# Patient Record
Sex: Female | Born: 1965 | Race: Black or African American | Hispanic: No | Marital: Married
Health system: Southern US, Community
[De-identification: ages and names within clinical notes are randomized; demographics above are authoritative.]

## PROBLEM LIST (undated history)

## (undated) DIAGNOSIS — O24419 Gestational diabetes mellitus in pregnancy, unspecified control: Secondary | ICD-10-CM

## (undated) DIAGNOSIS — K589 Irritable bowel syndrome without diarrhea: Secondary | ICD-10-CM

## (undated) DIAGNOSIS — I10 Essential (primary) hypertension: Secondary | ICD-10-CM

## (undated) HISTORY — PX: TONSILLECTOMY: SUR1361

## (undated) HISTORY — DX: Gestational diabetes mellitus in pregnancy, unspecified control: O24.419

## (undated) HISTORY — DX: Irritable bowel syndrome, unspecified: K58.9

## (undated) HISTORY — DX: Essential (primary) hypertension: I10

## (undated) HISTORY — PX: TUBAL LIGATION: SHX77

---

## 2000-08-16 ENCOUNTER — Other Ambulatory Visit: Admission: RE | Admit: 2000-08-16 | Discharge: 2000-08-16 | Payer: Self-pay | Admitting: Obstetrics and Gynecology

## 2000-09-26 ENCOUNTER — Ambulatory Visit (HOSPITAL_COMMUNITY): Admission: RE | Admit: 2000-09-26 | Discharge: 2000-09-26 | Payer: Self-pay | Admitting: Obstetrics and Gynecology

## 2000-09-26 ENCOUNTER — Encounter: Payer: Self-pay | Admitting: Obstetrics and Gynecology

## 2001-01-25 ENCOUNTER — Ambulatory Visit (HOSPITAL_COMMUNITY): Admission: RE | Admit: 2001-01-25 | Discharge: 2001-01-25 | Payer: Self-pay | Admitting: Obstetrics and Gynecology

## 2001-01-25 ENCOUNTER — Encounter: Payer: Self-pay | Admitting: Obstetrics and Gynecology

## 2001-02-11 ENCOUNTER — Inpatient Hospital Stay (HOSPITAL_COMMUNITY): Admission: AD | Admit: 2001-02-11 | Discharge: 2001-02-14 | Payer: Self-pay | Admitting: Obstetrics and Gynecology

## 2001-02-11 ENCOUNTER — Encounter (INDEPENDENT_AMBULATORY_CARE_PROVIDER_SITE_OTHER): Payer: Self-pay

## 2001-02-15 ENCOUNTER — Encounter: Admission: RE | Admit: 2001-02-15 | Discharge: 2001-03-17 | Payer: Self-pay | Admitting: Obstetrics and Gynecology

## 2001-03-18 ENCOUNTER — Encounter: Admission: RE | Admit: 2001-03-18 | Discharge: 2001-04-17 | Payer: Self-pay | Admitting: Obstetrics and Gynecology

## 2001-04-18 ENCOUNTER — Encounter: Admission: RE | Admit: 2001-04-18 | Discharge: 2001-05-18 | Payer: Self-pay | Admitting: Obstetrics and Gynecology

## 2001-06-16 ENCOUNTER — Encounter: Admission: RE | Admit: 2001-06-16 | Discharge: 2001-07-16 | Payer: Self-pay | Admitting: Obstetrics and Gynecology

## 2001-08-16 ENCOUNTER — Encounter: Admission: RE | Admit: 2001-08-16 | Discharge: 2001-09-15 | Payer: Self-pay | Admitting: Obstetrics and Gynecology

## 2002-06-16 ENCOUNTER — Inpatient Hospital Stay (HOSPITAL_COMMUNITY): Admission: AD | Admit: 2002-06-16 | Discharge: 2002-06-16 | Payer: Self-pay | Admitting: Obstetrics and Gynecology

## 2002-07-06 ENCOUNTER — Other Ambulatory Visit: Admission: RE | Admit: 2002-07-06 | Discharge: 2002-07-06 | Payer: Self-pay | Admitting: Obstetrics and Gynecology

## 2003-04-30 ENCOUNTER — Encounter: Admission: RE | Admit: 2003-04-30 | Discharge: 2003-04-30 | Payer: Self-pay | Admitting: Obstetrics and Gynecology

## 2003-09-03 ENCOUNTER — Other Ambulatory Visit: Admission: RE | Admit: 2003-09-03 | Discharge: 2003-09-03 | Payer: Self-pay | Admitting: Obstetrics and Gynecology

## 2004-12-09 ENCOUNTER — Other Ambulatory Visit: Admission: RE | Admit: 2004-12-09 | Discharge: 2004-12-09 | Payer: Self-pay | Admitting: Obstetrics and Gynecology

## 2005-03-23 ENCOUNTER — Ambulatory Visit: Payer: Self-pay | Admitting: Gastroenterology

## 2005-12-13 ENCOUNTER — Other Ambulatory Visit: Admission: RE | Admit: 2005-12-13 | Discharge: 2005-12-13 | Payer: Self-pay | Admitting: Obstetrics and Gynecology

## 2008-01-22 ENCOUNTER — Encounter: Admission: RE | Admit: 2008-01-22 | Discharge: 2008-01-22 | Payer: Self-pay | Admitting: Obstetrics and Gynecology

## 2009-03-03 ENCOUNTER — Encounter: Admission: RE | Admit: 2009-03-03 | Discharge: 2009-03-03 | Payer: Self-pay | Admitting: Obstetrics and Gynecology

## 2010-03-03 ENCOUNTER — Encounter
Admission: RE | Admit: 2010-03-03 | Discharge: 2010-03-03 | Payer: Self-pay | Source: Home / Self Care | Attending: Obstetrics and Gynecology | Admitting: Obstetrics and Gynecology

## 2010-07-31 NOTE — Op Note (Signed)
[facility]  Patient:    Megan Cuevas, Megan Cuevas Visit Number: [id number] MRN: [id number]          Service Type: OBS Location: 910A 9118 01 Attending Physician:  Megan Cuevas Dictated by:   Alvino Chapel, M.D. Proc. Date: 02/11/01 Admit Date:  02/11/2001                             Operative Report  PREOPERATIVE DIAGNOSES:         1. Term pregnancy at 38+ weeks delivered.                                 2. Previous cesarean section.                                 3. Failed vaginal birth after cesarean.  POSTOPERATIVE DIAGNOSES:        1. Term pregnancy at 38+ weeks delivered.                                 2. Previous cesarean section.                                 3. Failed vaginal birth after cesarean                                    section.  OPERATION:                      Repeat low transverse cesarean section with bilateral tubal ligation.  SURGEON:                        Alvino Chapel, M.D.  ANESTHESIA:                     Epidural.  FINDINGS:                       Viable female infant in the vertex presentation. Apgars are 8 and 9.  Weight is 8 pounds 9 ounces.  There are normal ovaries and tubes and uterus noted at the time of section.  ESTIMATED BLOOD LOSS:           600 cc.  URINE OUTPUT:                   300 cc clear and IV fluids 1800 cc LR.  DESCRIPTION OF PROCEDURE:       The patient was taken to the operating room where epidural anesthesia was found to be adequate by Allis clamp test.  She was then prepped and draped in a normal sterile fashion in the dorsal supine position with a leftward tilt.  A Pfannenstiel skin incision was then made after a wedge resection of a preexisting scar which was somewhat thickened, and this was carried down to the layer of fascia by sharp dissection and Bovie cautery.  The fascia was then nicked in the midline with Bovie cautery, and the incision was extended laterally  with Mayo scissors.  The inferior aspect of the incision was grasped with Kocher clamps, elevated and dissected off the underlying rectus muscles and in a similar fashion the superior aspect was elevated and dissected off the underlying rectus muscles.  The rectus muscles were then separated in the midline and the peritoneum identified and entered sharply.  The peritoneal incision was then extended both superiorly and inferiorly with careful attention to avoid both bowel and bladder.  The bladder blade was then inserted.  The vesicouterine peritoneum was identified, and the bladder flap created with Metzenbaum scissors.  The bladder blade was then reinserted and the lower uterine segment exposed.  The uterus was then incised in a transverse fashion and the infant delivered atraumatically.  The infant was bulb suctioned, the cord clamped and handed off to the awaiting pediatricians.  The placenta was then delivered manually and the uterus cleared of all clots and debris with a moist lap sponge.  The uterine incision was then closed with 1-0 chromic in a running locked fashion.  With this closed, attention was turned to the patients right fallopian tube which was grasped with Babcock clamp, elevated and 2-3 cm buckle tied off with two free ties of 0 plain.  This portion was then amputated and handed off to pathology and the free ends cauterized with Bovie cautery.  In a similar fashion, the left fallopian tube was grasped with a Babcock, elevated into a 2 cm buckle and doubly ligated with 0 plain.  This portion was also amputated and handed to pathology with the free end pedicle cauterized.  Both tubal pedicles were hemostatic, therefore returned to their position.  There was an adhesion of the omentum to the anterior fundus which was taken down with Bovie cautery. The uterine incision was once again inspected and one small area of bleeding was controlled with a single figure-of-eight suture  and 0 chromic.  It was once again inspected and found to be hemostatic.  Therefore, all instruments and sponges were removed from the abdomen.  The rectus muscles were reapproximated with two interrupted sutures of 0 chromic.  The fascia was closed with 0 Vicryl in a running fashion, and the skin was closed with staples.  Sponge, lap and needle counts were correct x 2, and the patient was taken to the recovery room in stable condition. Dictated by:   Alvino Chapel, M.D. Attending Physician:  Megan Cuevas DD:  02/11/01 TD:  02/11/01 Job: [id number]

## 2010-07-31 NOTE — Discharge Summary (Signed)
[facility]  Patient:    EUVA, RUNDELL Visit Number: [id number] MRN: [id number]          Service Type: OBS Location: 910A 9118 01 Attending Physician:  Oliver Pila Dictated by:   Alvino Chapel, M.D. Admit Date:  02/11/2001 Discharge Date: 02/14/2001                             Discharge Summary  DISCHARGE DIAGNOSES:          1. Term pregnancy at 38 weeks, delivered.                               2. Previous cesarean section.                               3. Failed vaginal birth after cesarean section.                               4. Repeat low transverse cesarean section.                               5. Bilateral partial salpingectomy.  DISCHARGE MEDICATIONS:        1. Motrin 600 mg p.o. q.6h. p.r.n.                               2. Percocet one to two tablets p.o. q.4h. p.r.n.  DISCHARGE FOLLOW UP:          The patient is to follow up in my office in approximately two days for staple removal.  HISTORY OF PRESENT ILLNESS:   Ardelia is a 45 year old, G2, P1-0-0-1 who is admitted at 38-0/7 weeks with the complaint of contractions every three minutes.  Cervix in triage was initially completely effaced, 7 cm and minus 1 station.  Given her advanced cervical dilation, the patient did wish an attempt at vaginal birth and therefore was admitted in labor.  Pregnancy had been complicated by history of gestational diabetes with the first pregnancy; however, the one hour Glucola had been tested twice in this pregnancy and was normal.  She had a previous low transverse cesarean section for rest of descend and was cleared for trial of labor.  PRENATAL LABORATORY DATA:     O positive antibody negative, RPR nonreactive, Rubella immune, hepatitis B surface antigen negative, HIV negative, GC negative, Chlamydia negative.  Group B Strep unknown.  PAST OB HISTORY:              In 1999 she had a low transverse cesarean section of a 6 pound  15 ounce infant.  PAST GYN HISTORY:             History of a Bartholin cyst drained.  PAST MEDICAL HISTORY:         Gestational diabetes with her first pregnancy.  PAST SURGICAL HISTORY:        Cesarean section times one and Bartholin cyst drainage.  PHYSICAL EXAMINATION:         On admission she was afebrile with stable vital signs.  Fetal heart rate was reactive.  HOSPITAL COURSE:  Shortly after admission she was completely effaced 9 cm and a minus 1 station, therefore she had assisted rupture of membranes with clear copious fluid obtained and was then observed for progress.  Approximately an hour and half later she was completely dilated; however, there was minimal progression of the vertex beyond a minus 1 station. A trial of pushing was attempted for approximately 45 minutes and very little progress was made as far descent of the fetal vertex.  At this point, the patient was counselled that likely the size was prohibiting delivery of this baby and she was counselled and desired to proceed with a repeat cesarean section and a concurrent tubal ligation.  She underwent a repeat low transverse cesarean section and bilateral tubal ligation without difficulty. She was delivered of a vigorous female infant.  Apgars were 8 and 9.  Weight was 8 pounds 9 ounces and had her tubal ligation without difficulty. On postoperative day three, the patient was doing very well.  She was passing flatus, tolerating a regular diet.  She had had a minimal temperature to 100.1; however, had been afebrile for approximately 12 hours prior to discharge, therefore, she was felt stable for discharge home and would monitor her temperature there and call for any temperature greater than 101. Dictated by:   Alvino Chapel, M.D. Attending Physician:  Oliver Pila DD:  02/14/01 TD:  02/14/01 Job: [id number]

## 2011-02-01 ENCOUNTER — Other Ambulatory Visit: Payer: Self-pay | Admitting: Obstetrics and Gynecology

## 2011-02-01 DIAGNOSIS — Z1231 Encounter for screening mammogram for malignant neoplasm of breast: Secondary | ICD-10-CM

## 2011-03-05 ENCOUNTER — Ambulatory Visit: Payer: Self-pay

## 2011-03-12 ENCOUNTER — Ambulatory Visit: Payer: Self-pay

## 2011-05-27 ENCOUNTER — Ambulatory Visit
Admission: RE | Admit: 2011-05-27 | Discharge: 2011-05-27 | Disposition: A | Discharge status: Home / Self Care | Payer: BC Managed Care – PPO | Source: Ambulatory Visit | Attending: Obstetrics and Gynecology | Admitting: Obstetrics and Gynecology

## 2011-05-27 DIAGNOSIS — Z1231 Encounter for screening mammogram for malignant neoplasm of breast: Secondary | ICD-10-CM

## 2012-03-15 HISTORY — PX: COLONOSCOPY: SHX174

## 2012-04-11 ENCOUNTER — Encounter: Payer: Self-pay | Admitting: Gastroenterology

## 2012-04-12 ENCOUNTER — Encounter: Payer: Self-pay | Admitting: *Deleted

## 2012-04-20 ENCOUNTER — Ambulatory Visit: Payer: BC Managed Care – PPO | Admitting: Gastroenterology

## 2012-04-25 ENCOUNTER — Other Ambulatory Visit: Payer: Self-pay | Admitting: Obstetrics and Gynecology

## 2012-04-25 DIAGNOSIS — Z1231 Encounter for screening mammogram for malignant neoplasm of breast: Secondary | ICD-10-CM

## 2012-04-28 ENCOUNTER — Ambulatory Visit: Payer: BC Managed Care – PPO | Admitting: Gastroenterology

## 2012-05-09 ENCOUNTER — Other Ambulatory Visit (INDEPENDENT_AMBULATORY_CARE_PROVIDER_SITE_OTHER): Payer: BC Managed Care – PPO

## 2012-05-09 ENCOUNTER — Ambulatory Visit (INDEPENDENT_AMBULATORY_CARE_PROVIDER_SITE_OTHER): Payer: BC Managed Care – PPO | Admitting: Gastroenterology

## 2012-05-09 ENCOUNTER — Encounter: Payer: Self-pay | Admitting: Gastroenterology

## 2012-05-09 VITALS — BP 102/70 | HR 88 | Ht 63.0 in | Wt 140.0 lb

## 2012-05-09 DIAGNOSIS — R195 Other fecal abnormalities: Secondary | ICD-10-CM

## 2012-05-09 LAB — IBC PANEL: Transferrin: 329.3 mg/dL (ref 212.0–360.0)

## 2012-05-09 LAB — FERRITIN: Ferritin: 110.7 ng/mL (ref 10.0–291.0)

## 2012-05-09 LAB — VITAMIN B12: Vitamin B-12: 275 pg/mL (ref 211–911)

## 2012-05-09 MED ORDER — LINACLOTIDE 145 MCG PO CAPS: 145.0000 ug | ORAL_CAPSULE | Freq: Every day | ORAL | Status: DC

## 2012-05-09 MED ORDER — MOVIPREP 100 G PO SOLR: 1.0000 | Freq: Once | ORAL | Status: DC

## 2012-05-09 NOTE — Patient Instructions (Addendum)
You were given a sheet today about Artificial Sweeteners for you to follow.  Please purchase Lactaid over the counter and use as needed.  You were given samples today of Linzess 145 mcg, please take one capsule by mouth once daily. If this works well for you please call back for a prescription.  Please purchase Metamucil over the counter. Take as directed.  Please follow high fiber diet below.  Your physician has requested that you go to the basement for the following lab work before leaving today: Anemia panel, and Celiac Panel.  You have been scheduled for a colonoscopy with propofol. Please follow written instructions given to you at your visit today.  Please pick up your prep kit at the pharmacy within the next 1-3 days. If you use inhalers (even only as needed) or a CPAP machine, please bring them with you on the day of your procedure.   ______________________________________________________________________________________________________________________  High-Fiber Diet Fiber is found in fruits, vegetables, and grains. A high-fiber diet encourages the addition of more whole grains, legumes, fruits, and vegetables in your diet. The recommended amount of fiber for adult males is 38 g per day. For adult females, it is 25 g per day. Pregnant and lactating women should get 28 g of fiber per day. If you have a digestive or bowel problem, ask your caregiver for advice before adding high-fiber foods to your diet. Eat a variety of high-fiber foods instead of only a select few type of foods.  PURPOSE  To increase stool bulk.  To make bowel movements more regular to prevent constipation.  To lower cholesterol.  To prevent overeating. WHEN IS THIS DIET USED?  It may be used if you have constipation and hemorrhoids.  It may be used if you have uncomplicated diverticulosis (intestine condition) and irritable bowel syndrome.  It may be used if you need help with weight management.  It  may be used if you want to add it to your diet as a protective measure against atherosclerosis, diabetes, and cancer. SOURCES OF FIBER  Whole-grain breads and cereals.  Fruits, such as apples, oranges, bananas, berries, prunes, and pears.  Vegetables, such as green peas, carrots, sweet potatoes, beets, broccoli, cabbage, spinach, and artichokes.  Legumes, such split peas, soy, lentils.  Almonds. FIBER CONTENT IN FOODS Starches and Grains / Dietary Fiber (g)  Cheerios, 1 cup / 3 g  Corn Flakes cereal, 1 cup / 0.7 g  Rice crispy treat cereal, 1 cup / 0.3 g  Instant oatmeal (cooked),  cup / 2 g  Frosted wheat cereal, 1 cup / 5.1 g  Brown, long-grain rice (cooked), 1 cup / 3.5 g  White, long-grain rice (cooked), 1 cup / 0.6 g  Enriched macaroni (cooked), 1 cup / 2.5 g Legumes / Dietary Fiber (g)  Baked beans (canned, plain, or vegetarian),  cup / 5.2 g  Kidney beans (canned),  cup / 6.8 g  Pinto beans (cooked),  cup / 5.5 g Breads and Crackers / Dietary Fiber (g)  Plain or honey graham crackers, 2 squares / 0.7 g  Saltine crackers, 3 squares / 0.3 g  Plain, salted pretzels, 10 pieces / 1.8 g  Whole-wheat bread, 1 slice / 1.9 g  White bread, 1 slice / 0.7 g  Raisin bread, 1 slice / 1.2 g  Plain bagel, 3 oz / 2 g  Flour tortilla, 1 oz / 0.9 g  Corn tortilla, 1 small / 1.5 g  Hamburger or hotdog bun, 1 small /  0.9 g Fruits / Dietary Fiber (g)  Apple with skin, 1 medium / 4.4 g  Sweetened applesauce,  cup / 1.5 g  Banana,  medium / 1.5 g  Grapes, 10 grapes / 0.4 g  Orange, 1 small / 2.3 g  Raisin, 1.5 oz / 1.6 g  Melon, 1 cup / 1.4 g Vegetables / Dietary Fiber (g)  Green beans (canned),  cup / 1.3 g  Carrots (cooked),  cup / 2.3 g  Broccoli (cooked),  cup / 2.8 g  Peas (cooked),  cup / 4.4 g  Mashed potatoes,  cup / 1.6 g  Lettuce, 1 cup / 0.5 g  Corn (canned),  cup / 1.6 g  Tomato,  cup / 1.1 g Document Released:  03/01/2005 Document Revised: 08/31/2011 Document Reviewed: 06/03/2011 [facility] Patient Information 2013 [place], [place].

## 2012-05-09 NOTE — Progress Notes (Signed)
History of Present Illness:  This is a 47 year old African American female referred by Dr. Theadora Rama her chronic functional constipation with associated gas and bloating.  This patient has had constipation for many years with recent worsening over the last several months. . Recently she underwent an episode where there was no bowel movement for 2 weeks requiring rereated MiraLax therapy.  She does have mild lactose intolerance and avoids these substances ,but does eat a  large amount of broccoli daily.  With her constipation, gas and bloating, she will have a soft semi-formed bowel movement every 3-4 days without melena or hematochezia.  Otherwise she's had no anorexia, weight loss, or specific food intolerances besides lactose.  There is no history of upper GI or hepatobiliary complaints.  She relates her lab data has all been normal with yearly lab exams.  She does take daily probiotics with rather marked improvement her symptoms.  She's not had previous colonoscopy or barium enema.  Patient is otherwise healthy without general medical or gynecologic problems.  He works as a Forensic scientist.   I have reviewed this patient's present history, medical and surgical past history, allergies and medications.     ROS:   All systems were reviewed and are negative unless otherwise stated in the HPI.... no gynecologic problems patient is on Aviane estradiol daily.  Not on File No outpatient prescriptions prior to visit.   No facility-administered medications prior to visit.   Past Medical History  Diagnosis Date  . Gestational diabetes    Past Surgical History  Procedure Laterality Date  . Cesarean section      x 2  . Tonsillectomy     History   Social History  . Marital Status: Married    Spouse Name: N/A    Number of Children: 2  . Years of Education: N/A   Occupational History  . ENGINEER [employer]   Social History Main Topics  . Smoking status: Never Smoker   . Smokeless tobacco:  Never Used  . Alcohol Use: Yes     Comment: 1 glass a week  . Drug Use: No  . Sexually Active: None   Other Topics Concern  . None   Social History Narrative  . None   Family History  Problem Relation Age of Onset  . Diabetes Mother   . Diabetes Maternal Grandmother   . Breast cancer Paternal Aunt     x 2       Physical Exam: Blood pressure 102/70, pulse 88 and regular, and weight 140 with a BMI of 24.81.  98% oxygen saturation at room air. General well developed well nourished patient in no acute distress, appearing their stated age Eyes PERRLA, no icterus, fundoscopic exam per opthamologist Skin no lesions noted Neck supple, no adenopathy, no thyroid enlargement, no tenderness Chest clear to percussion and auscultation Heart no significant murmurs, gallops or rubs noted Abdomen no hepatosplenomegaly masses or tenderness, BS normal.  Rectal inspection normal no fissures, or fistulae noted.  No masses or tenderness on digital exam. Stool guaiac negative. Extremities no acute joint lesions, edema, phlebitis or evidence of cellulitis. Neurologic patient oriented x 3, cranial nerves intact, no focal neurologic deficits noted... reflexes normal. Psychological mental status normal and normal affect.  Assessment and plan: Chronic functional constipation without symptoms of rectal outlet dysfunction.  I suspect she has mild chronic atony with associated lactose intolerance.  I've scheduled her for colonoscopy exam to exclude any structural or inflammatory process.  I've asked her  to follow a high fiber diet with daily Metamucil and liberal by mouth fluids, and given her a trial of Linzess 145 mcg a day.  She is use when necessary Lactaid tablets with major dairy products and avoid sorbitol and fructose in her diet.  I have ordered anemia and celiac panel for review.  Family history is entirely noncontributory.  No diagnosis found.

## 2012-05-10 ENCOUNTER — Encounter: Payer: Self-pay | Admitting: Gastroenterology

## 2012-05-10 LAB — CELIAC PANEL 10
Endomysial Screen: NEGATIVE
Gliadin IgA: 7.7 U/mL (ref <20)
Tissue Transglut Ab: 10.8 U/mL (ref <20)
Tissue Transglutaminase Ab, IgA: 4.5 U/mL (ref <20)

## 2012-05-29 ENCOUNTER — Ambulatory Visit
Admission: RE | Admit: 2012-05-29 | Discharge: 2012-05-29 | Disposition: A | Discharge status: Home / Self Care | Payer: BC Managed Care – PPO | Source: Ambulatory Visit | Attending: Obstetrics and Gynecology | Admitting: Obstetrics and Gynecology

## 2012-06-02 ENCOUNTER — Encounter: Payer: BC Managed Care – PPO | Admitting: Gastroenterology

## 2012-06-05 ENCOUNTER — Encounter: Payer: Self-pay | Admitting: Gastroenterology

## 2012-06-05 ENCOUNTER — Ambulatory Visit (AMBULATORY_SURGERY_CENTER): Payer: BC Managed Care – PPO | Admitting: Gastroenterology

## 2012-06-05 VITALS — BP 120/72 | HR 72 | Temp 99.7°F | Resp 17 | Ht 63.0 in | Wt 140.0 lb

## 2012-06-05 DIAGNOSIS — Z1211 Encounter for screening for malignant neoplasm of colon: Secondary | ICD-10-CM

## 2012-06-05 DIAGNOSIS — K5900 Constipation, unspecified: Secondary | ICD-10-CM

## 2012-06-05 DIAGNOSIS — R195 Other fecal abnormalities: Secondary | ICD-10-CM

## 2012-06-05 MED ORDER — SODIUM CHLORIDE 0.9 % IV SOLN: 500.0000 mL | INTRAVENOUS | Status: DC

## 2012-06-05 NOTE — Op Note (Signed)
[facility] [address] [place], [address]   COLONOSCOPY PROCEDURE REPORT  PATIENT: Megan Cuevas, Megan Cuevas  MR#: [id number] BIRTHDATE: 1965/06/06 , 46  yrs. old GENDER: Female ENDOSCOPIST: Mardella Layman, MD, Clementeen Graham REFERRED BY:  Theadora Rama, M.D. PROCEDURE DATE:  06/05/2012 PROCEDURE:   Colonoscopy, screening ASA CLASS:   Class I INDICATIONS:Average risk patient for colon cancer. MEDICATIONS: propofol (Diprivan) 200mg  IV  DESCRIPTION OF PROCEDURE:   After the risks and benefits and of the procedure were explained, informed consent was obtained.  A digital rectal exam revealed no abnormalities of the rectum.    The LB [id number]  endoscope was introduced through the anus and advanced to the cecum, which was identified by both the appendix and ileocecal valve .  The quality of the prep was excellent, using MoviPrep .  The instrument was then slowly withdrawn as the colon was fully examined.     COLON FINDINGS: A normal appearing cecum, ileocecal valve, and appendiceal orifice were identified.  The ascending, hepatic flexure, transverse, splenic flexure, descending, sigmoid colon and rectum appeared unremarkable.  No polyps or cancers were seen.Very tortuous colon noted !!!!     Retroflexed views revealed no abnormalities.     The scope was then withdrawn from the patient and the procedure completed.  COMPLICATIONS: There were no complications. ENDOSCOPIC IMPRESSION: Normal colon ..tortuous and redundant colon noted...no IBD noted...  RECOMMENDATIONS: 1.  High fiber diet with liberal fluid intake. 2.  Metamucil or benefiber 3.  Titrate to need.Marland KitchenMarland KitchenMiralax or chronulac   REPEAT EXAM:  cc:  _______________________________ eSignedMardella Layman, MD, [facility] 06/05/2012 11:42 AM

## 2012-06-05 NOTE — Patient Instructions (Addendum)
YOU HAD AN ENDOSCOPIC PROCEDURE TODAY AT THE [facility]: Refer to the procedure report that was given to you for any specific questions about what was found during the examination.  If the procedure report does not answer your questions, please call your gastroenterologist to clarify.  If you requested that your care partner not be given the details of your procedure findings, then the procedure report has been included in a sealed envelope for you to review at your convenience later.  YOU SHOULD EXPECT: Some feelings of bloating in the abdomen. Passage of more gas than usual.  Walking can help get rid of the air that was put into your GI tract during the procedure and reduce the bloating. If you had a lower endoscopy (such as a colonoscopy or flexible sigmoidoscopy) you may notice spotting of blood in your stool or on the toilet paper. If you underwent a bowel prep for your procedure, then you may not have a normal bowel movement for a few days.  DIET: Your first meal following the procedure should be a light meal and then it is ok to progress to your normal diet.  A half-sandwich or bowl of soup is an example of a good first meal.  Heavy or fried foods are harder to digest and may make you feel nauseous or bloated.  Likewise meals heavy in dairy and vegetables can cause extra gas to form and this can also increase the bloating.  Drink plenty of fluids but you should avoid alcoholic beverages for 24 hours.  Dr. Jarold Motto wants you to use mirilax for constipation relief.  He also wants you to eat a high fiber diet.  Your colonoscopy was normal outside of your twisty colon.  Drink plenty of fluids during the day.  ACTIVITY: Your care partner should take you home directly after the procedure.  You should plan to take it easy, moving slowly for the rest of the day.  You can resume normal activity the day after the procedure however you should NOT DRIVE or use heavy machinery for 24 hours (because of  the sedation medicines used during the test).    SYMPTOMS TO REPORT IMMEDIATELY: A gastroenterologist can be reached at any hour.  During normal business hours, 8:30 AM to 5:00 PM Monday through Friday, call [phone].  After hours and on weekends, please call the GI answering service at [phone] who will take a message and have the physician on call contact you.   Following lower endoscopy (colonoscopy or flexible sigmoidoscopy):  Excessive amounts of blood in the stool  Significant tenderness or worsening of abdominal pains  Swelling of the abdomen that is new, acute  Fever of 100F or higher FOLLOW UP: If any biopsies were taken you will be contacted by phone or by letter within the next 1-3 weeks.  Call your gastroenterologist if you have not heard about the biopsies in 3 weeks.  Our staff will call the home number listed on your records the next business day following your procedure to check on you and address any questions or concerns that you may have at that time regarding the information given to you following your procedure. This is a courtesy call and so if there is no answer at the home number and we have not heard from you through the emergency physician on call, we will assume that you have returned to your regular daily activities without incident.  SIGNATURES/CONFIDENTIALITY: You and/or your care partner have signed paperwork which  will be entered into your electronic medical record.  These signatures attest to the fact that that the information above on your After Visit Summary has been reviewed and is understood.  Full responsibility of the confidentiality of this discharge information lies with you and/or your care-partner.

## 2012-06-05 NOTE — Progress Notes (Addendum)
Patient did not have preoperative order for IV antibiotic SSI prophylaxis. [phone])  Patient did not experience any of the following events: a burn prior to discharge; a fall within the facility; wrong site/side/patient/procedure/implant event; or a hospital transfer or hospital admission upon discharge from the facility. [phone])  Patient states has lower abd pain just before discharge.  She states that this is nothing unusual, that her colon gets trapped air all of the time.  She stated that she would go home and lie down with a heating pad.  I suggested beano.  She stated that she would try that and start the mirilax in the am.  Patient insisted on going home at this time.  She will call if pain worsens.

## 2012-06-06 ENCOUNTER — Telehealth: Payer: Self-pay | Admitting: *Deleted

## 2012-06-06 NOTE — Telephone Encounter (Signed)
  Follow up Call-  Call back number 06/05/2012  Post procedure Call Back phone  # [phone]  Permission to leave phone message Yes     Patient questions:  Do you have a fever, pain , or abdominal swelling? no Pain Score  0 *  Have you tolerated food without any problems? yes  Have you been able to return to your normal activities? yes  Do you have any questions about your discharge instructions: Diet   no Medications  no Follow up visit  no  Do you have questions or concerns about your Care? no  Actions: * If pain score is 4 or above: No action needed, pain <4.

## 2012-07-02 ENCOUNTER — Emergency Department (HOSPITAL_COMMUNITY)
Admission: EM | Admit: 2012-07-02 | Discharge: 2012-07-02 | Disposition: A | Discharge status: Home / Self Care | Payer: BC Managed Care – PPO | Attending: Emergency Medicine | Admitting: Emergency Medicine

## 2012-07-02 ENCOUNTER — Encounter (HOSPITAL_COMMUNITY): Payer: Self-pay

## 2012-07-02 ENCOUNTER — Emergency Department (HOSPITAL_COMMUNITY): Payer: BC Managed Care – PPO

## 2012-07-02 DIAGNOSIS — M6281 Muscle weakness (generalized): Secondary | ICD-10-CM | POA: Insufficient documentation

## 2012-07-02 DIAGNOSIS — M25511 Pain in right shoulder: Secondary | ICD-10-CM

## 2012-07-02 DIAGNOSIS — R52 Pain, unspecified: Secondary | ICD-10-CM | POA: Insufficient documentation

## 2012-07-02 DIAGNOSIS — R209 Unspecified disturbances of skin sensation: Secondary | ICD-10-CM | POA: Insufficient documentation

## 2012-07-02 DIAGNOSIS — Z8632 Personal history of gestational diabetes: Secondary | ICD-10-CM | POA: Insufficient documentation

## 2012-07-02 DIAGNOSIS — M25519 Pain in unspecified shoulder: Secondary | ICD-10-CM | POA: Insufficient documentation

## 2012-07-02 DIAGNOSIS — Z79899 Other long term (current) drug therapy: Secondary | ICD-10-CM | POA: Insufficient documentation

## 2012-07-02 MED ORDER — METHOCARBAMOL 500 MG PO TABS: 500.0000 mg | ORAL_TABLET | Freq: Two times a day (BID) | ORAL | Status: DC

## 2012-07-02 MED ORDER — METHOCARBAMOL 500 MG PO TABS
500.0000 mg | ORAL_TABLET | Freq: Once | ORAL | Status: AC
  Administered 2012-07-02: 500 mg via ORAL
  Filled 2012-07-02: qty 1

## 2012-07-02 MED ORDER — HYDROCODONE-ACETAMINOPHEN 5-325 MG PO TABS: 2.0000 | ORAL_TABLET | ORAL | Status: DC | PRN

## 2012-07-02 NOTE — ED Notes (Signed)
PA has been in to eval already

## 2012-07-02 NOTE — ED Provider Notes (Signed)
History     CSN: [id number]  Arrival date & time 07/02/12  7829   First MD Initiated Contact with Patient 07/02/12 0744      No chief complaint on file.   (Consider location/radiation/quality/duration/timing/severity/associated sxs/prior treatment) HPI  47 year old female with no significant past medical history presents complaining of R shoulder pain. Patient reports for the past 3 days she has had pain and tingling sensation to her right shoulder and right arm. Symptom has been persistent, not getting better or worse. Pain is described as a heaviness pressure sensation throughout upper arm, persistent at rest and worsening with movement. Unable to raise her arm above her shoulder.  Report weakness to her R arm.  Does not recall any specific injury.  Has tried ibuprofen, icyhot, aleve without relief.  Denies fever, neck pain, cp, sob, rash.  Pt is RHD.  Never had this problem before.    Past Medical History  Diagnosis Date  . Gestational diabetes     Past Surgical History  Procedure Laterality Date  . Cesarean section      x 2  . Tonsillectomy      Family History  Problem Relation Age of Onset  . Diabetes Mother   . Diabetes Maternal Grandmother   . Breast cancer Paternal Aunt     x 2    History  Substance Use Topics  . Smoking status: Never Smoker   . Smokeless tobacco: Never Used  . Alcohol Use: Yes     Comment: 1 glass a week    OB History   Grav Para Term Preterm Abortions TAB SAB Ect Mult Living                  Review of Systems  Constitutional: Negative for fever.  HENT: Negative for neck pain.   Musculoskeletal: Negative for back pain.  Skin: Negative for rash and wound.  Neurological: Positive for numbness. Negative for headaches.    Allergies  Review of patient's allergies indicates no known allergies.  Home Medications   Current Outpatient Rx  Name  Route  Sig  Dispense  Refill  . desonide (DESOWEN) 0.05 % lotion               .  DIFFERIN 0.3 % gel               . hydrOXYzine (ATARAX/VISTARIL) 10 MG tablet               . levonorgestrel-ethinyl estradiol (AVIANE) 0.1-20 MG-MCG tablet   Oral   Take 1 tablet by mouth daily.         . Linaclotide (LINZESS) 145 MCG CAPS   Oral   Take 1 capsule (145 mcg total) by mouth daily.   12 capsule   0     Samples given to patient    Lot#      [id number]      ...   . Probiotic Product (PROBIOTIC DAILY PO)   Oral   Take 1 capsule by mouth daily.           BP 122/81  Pulse 89  Temp(Src) 98.4 F (36.9 C) (Oral)  Resp 16  Ht 5\' 3"  (1.6 m)  Wt 134 lb (60.782 kg)  BMI 23.74 kg/m2  SpO2 99%  Physical Exam  Nursing note and vitals reviewed. Constitutional: She is oriented to person, place, and time. She appears well-developed and well-nourished.  HENT:  Head: Atraumatic.  Eyes: Conjunctivae are normal.  Neck: Normal  range of motion. Neck supple.  Cardiovascular: Normal rate and intact distal pulses.   Pulmonary/Chest: Effort normal and breath sounds normal.  Abdominal: Soft. There is no tenderness.  Musculoskeletal: She exhibits tenderness (R shoulder: generalized tenderness to posterior shoulder with decreased ROM with abduction/adduction, and rotation.  Unable to abduct more than 40 degree.  No deformity noted.  sensation intact to  all major nerve distribution).       Right shoulder: She exhibits decreased range of motion, tenderness, pain and decreased strength. She exhibits no bony tenderness, no swelling, no effusion, no crepitus and no deformity.       Right elbow: Normal.      Right wrist: Normal.       Cervical back: Normal.       Right hand: Normal.  R arm without edema, radial pulse 2+, brisk cap refill.  Decreased grip strength as compare to L hand.  Limited ROM to R shoulder due to pain with both active and passive ROM.  Point tenderness to R midscapular region without deformity.    Neurological: She is alert and oriented to person, place, and  time.  Skin: Skin is warm. No rash noted.  Psychiatric: She has a normal mood and affect.    ED Course  Procedures (including critical care time)  8:08 AM Pt reports R shoulder and R upper arm pain with subjective paresthesia and weakness. No evidence of trauma or dislocation noted on exam, however since pt has limited ROM, will obtain xray to r/o fx or dislocation.  DDx: brachial plexus syndrome, tendinitis, rotator cuff tear, adhesive capsulitis.  Doubt stroke, MI, septic arthritis, gout.  9:21 AM Xray negative.  My attending also evaluate pt and felt it's likely rotator cuff injury vs. Tendonitis.  Will give sling for support, pain meds, muscle relaxant, therapy suggestion to prevent adhesive capsulitis.  Ortho as needed.  Return precaution.    Labs Reviewed - No data to display Dg Shoulder Right  07/02/2012  *RADIOLOGY REPORT*  Clinical Data: Right shoulder pain, no known injury  RIGHT SHOULDER - 2+ VIEW  Comparison: None.  Findings: No fracture or dislocation is seen.  The joint spaces are preserved.  The visualized soft tissues are unremarkable.  Visualized right lung is clear.  IMPRESSION: Normal right shoulder radiographs.   Original Report Authenticated By: Charline Bills, M.D.      1. Shoulder pain, acute, right       MDM  BP 132/87  Pulse 66  Temp(Src) 98 F (36.7 C) (Oral)  Resp 16  Ht 5\' 3"  (1.6 m)  Wt 134 lb (60.782 kg)  BMI 23.74 kg/m2  SpO2 100%  LMP 06/08/2012  I have reviewed nursing notes and vital signs. I personally reviewed the imaging tests through PACS system  I reviewed available ER/hospitalization records thought the EMR         Fayrene Helper, [place] 07/02/12 4098

## 2012-07-02 NOTE — ED Notes (Signed)
Prescriptions and medications reviewed

## 2012-07-02 NOTE — ED Provider Notes (Signed)
Medical screening examination/treatment/procedure(s) were conducted as a shared visit with non-physician practitioner(s) and myself.  I personally evaluated the patient during the encounter.  This 47 year old female has a few days of localized right shoulder pain without radiation the pain is constant worse with palpation worse with movement with inability to actively abduct to 90 she also some decreased flexion and decreased internal and external rotation as well with normal light touch over the deltoid and intact light touch and motor strength to the hand in the distributions of the radial median and ulnar function with tenderness to the posterior shoulder and abnormal drop test.  Hurman Horn, MD 07/07/12 [phone]

## 2012-07-02 NOTE — ED Notes (Signed)
Patient having right arm pain and difficulty moving it since two days ago. Having difficulty lifting arm.

## 2012-08-23 ENCOUNTER — Emergency Department (HOSPITAL_COMMUNITY): Payer: BC Managed Care – PPO

## 2012-08-23 ENCOUNTER — Emergency Department (HOSPITAL_COMMUNITY)
Admission: EM | Admit: 2012-08-23 | Discharge: 2012-08-23 | Disposition: A | Discharge status: Home / Self Care | Payer: BC Managed Care – PPO | Attending: Emergency Medicine | Admitting: Emergency Medicine

## 2012-08-23 ENCOUNTER — Encounter (HOSPITAL_COMMUNITY): Payer: Self-pay | Admitting: *Deleted

## 2012-08-23 DIAGNOSIS — R079 Chest pain, unspecified: Secondary | ICD-10-CM

## 2012-08-23 DIAGNOSIS — Z8742 Personal history of other diseases of the female genital tract: Secondary | ICD-10-CM | POA: Insufficient documentation

## 2012-08-23 DIAGNOSIS — Z79899 Other long term (current) drug therapy: Secondary | ICD-10-CM | POA: Insufficient documentation

## 2012-08-23 LAB — POCT I-STAT TROPONIN I
Troponin i, poc: 0 ng/mL (ref 0.00–0.08)
Troponin i, poc: 0 ng/mL (ref 0.00–0.08)

## 2012-08-23 LAB — BASIC METABOLIC PANEL
CO2: 23 mEq/L (ref 19–32)
Chloride: 104 mEq/L (ref 96–112)
Creatinine, Ser: 0.74 mg/dL (ref 0.50–1.10)
GFR calc Af Amer: >90 mL/min (ref >90)
Potassium: 3.9 mEq/L (ref 3.5–5.1)
Sodium: 135 mEq/L (ref 135–145)

## 2012-08-23 LAB — CBC
Platelets: 301 10*3/uL (ref 150–400)
RBC: 4.81 MIL/uL (ref 3.87–5.11)
RDW: 12.5 % (ref 11.5–15.5)

## 2012-08-23 MED ORDER — GI COCKTAIL ~~LOC~~
30.0000 mL | Freq: Once | ORAL | Status: AC
  Administered 2012-08-23: 30 mL via ORAL
  Filled 2012-08-23: qty 30

## 2012-08-23 MED ORDER — FAMOTIDINE 20 MG PO TABS
20.0000 mg | ORAL_TABLET | Freq: Once | ORAL | Status: AC
  Administered 2012-08-23: 20 mg via ORAL
  Filled 2012-08-23: qty 1

## 2012-08-23 NOTE — ED Notes (Signed)
Per EMS- pt was at home when she began having left sided chest radiates to shoulder blade. Pt states that pain is worse with movement but not breathing. Pt received 324mg  of asprin and 1 nitro from the nurse at [facility]. Pt received 1 nitro from ems as well With no relief.

## 2012-08-23 NOTE — ED Provider Notes (Signed)
History     CSN: [id number]  Arrival date & time 08/23/12  1409   First MD Initiated Contact with Patient 08/23/12 1502      Chief Complaint  Patient presents with  . Chest Pain    HPI  Patient presents with concerns of chest pain. She had a similar event, though less severe and shorter lasting yesterday, and awoke today, approximately 9 hours ago with left superior chest pain.  The pain feels as though there is pressure, has minimal radiation towards the left shoulder blade.  The pain is worse with supine positioning, or motion, but not particularly changed with breathing.  The pain improved somewhat with rest, nitroglycerin, aspirin. There is no concurrent dyspnea, lightheadedness, nausea, vomiting, confusion, disorientation, visual changes. The patient does not smoke, is active.  She finished a 5k race last weekend. She denies other notable medical problems. She does take birth control.   Past Medical History  Diagnosis Date  . Gestational diabetes     Past Surgical History  Procedure Laterality Date  . Cesarean section      x 2  . Tonsillectomy      Family History  Problem Relation Age of Onset  . Diabetes Mother   . Diabetes Maternal Grandmother   . Breast cancer Paternal Aunt     x 2    History  Substance Use Topics  . Smoking status: Never Smoker   . Smokeless tobacco: Never Used  . Alcohol Use: Yes     Comment: 1 glass a week    OB History   Grav Para Term Preterm Abortions TAB SAB Ect Mult Living                  Review of Systems  Constitutional:       Per HPI, otherwise negative  HENT:       Per HPI, otherwise negative  Respiratory:       Per HPI, otherwise negative  Cardiovascular:       Per HPI, otherwise negative  Gastrointestinal: Negative for vomiting.  Endocrine:       Negative aside from HPI  Genitourinary:       Neg aside from HPI   Musculoskeletal:       Per HPI, otherwise negative  Skin: Negative.   Neurological: Negative  for syncope.    Allergies  Review of patient's allergies indicates no known allergies.  Home Medications   Current Outpatient Rx  Name  Route  Sig  Dispense  Refill  . Azelaic Acid (FINACEA) 15 % cream   Topical   Apply 1 application topically daily. After skin is thoroughly washed and patted dry, gently but thoroughly massage a thin film of azelaic acid cream into the affected area twice daily, in the morning and evening.         Marland Kitchen DIFFERIN 0.3 % gel   Topical   Apply 1 application topically at bedtime.          Marland Kitchen levonorgestrel-ethinyl estradiol (AVIANE) 0.1-20 MG-MCG tablet   Oral   Take 1 tablet by mouth daily.         . psyllium (METAMUCIL) 58.6 % powder   Oral   Take 1 packet by mouth daily.           BP 127/78  Pulse 65  Temp(Src) 97.7 F (36.5 C) (Oral)  Resp 15  SpO2 100%  LMP 08/10/2012  Physical Exam  Nursing note and vitals reviewed. Constitutional: She is oriented  to person, place, and time. She appears well-developed and well-nourished. No distress.  HENT:  Head: Normocephalic and atraumatic.  Eyes: Conjunctivae and EOM are normal.  Cardiovascular: Normal rate and regular rhythm.   Pulmonary/Chest: Effort normal and breath sounds normal. No stridor. No respiratory distress.  Abdominal: She exhibits no distension.  Musculoskeletal: She exhibits no edema.  Neurological: She is alert and oriented to person, place, and time. No cranial nerve deficit.  Skin: Skin is warm and dry.  Psychiatric: She has a normal mood and affect.    ED Course  Procedures (including critical care time)  Labs Reviewed  CBC  BASIC METABOLIC PANEL  POCT I-STAT TROPONIN I   Dg Chest Port 1 View  08/23/2012   *RADIOLOGY REPORT*  Clinical Data: Chest pain and shortness of breath.  PORTABLE CHEST - 1 VIEW  Comparison: None available.  Findings: Heart size is normal.  The lungs are clear.  The visualized soft tissues and bony thorax are unremarkable.  IMPRESSION: No  acute cardiopulmonary disease.   Original Report Authenticated By: Marin Roberts, M.D.     No diagnosis found.  Pulse ox 100% room air normal Cardiac 75 sinus rhythm normal    Date: 08/23/2012  Rate: 74  Rhythm: normal sinus rhythm  QRS Axis: normal  Intervals: normal  ST/T Wave abnormalities: nonspecific T wave changes  Conduction Disutrbances:none  Narrative Interpretation:   Old EKG Reviewed: none available Q-waves anteriorly, borderline ECG  Update: Patient's pain is 2/10.  She defers pain med offer.  Update: trop #2 - negative MDM  This young female presents with left-sided chest pain.  On exam she is awake alert, not hypoxic, tachypneic, tachycardic or in any distress.  Patient has minimal risk factors, and given her description of an active exercise routine, including recent completion of a 5K race, there is low suspicion for ACS.  The patient's evaluation, including ECG, serial troponins was reassuring, which is likely reflective of the absence of ongoing coronary ischemia, given the passage of more than 10 hours since the onset of pain and the second troponin.  She was discharged with initiation of anti-inflammatories, primary care followup  Gerhard Munch, MD 08/23/12 1610

## 2013-05-01 ENCOUNTER — Other Ambulatory Visit: Payer: Self-pay

## 2013-05-01 DIAGNOSIS — Z1231 Encounter for screening mammogram for malignant neoplasm of breast: Secondary | ICD-10-CM

## 2013-06-06 ENCOUNTER — Ambulatory Visit
Admission: RE | Admit: 2013-06-06 | Discharge: 2013-06-06 | Disposition: A | Discharge status: Home / Self Care | Payer: BC Managed Care – PPO | Source: Ambulatory Visit

## 2013-06-06 DIAGNOSIS — Z1231 Encounter for screening mammogram for malignant neoplasm of breast: Secondary | ICD-10-CM

## 2017-08-10 ENCOUNTER — Other Ambulatory Visit: Payer: Self-pay | Admitting: Obstetrics and Gynecology

## 2017-08-10 DIAGNOSIS — R928 Other abnormal and inconclusive findings on diagnostic imaging of breast: Secondary | ICD-10-CM

## 2017-08-12 ENCOUNTER — Ambulatory Visit
Admission: RE | Admit: 2017-08-12 | Discharge: 2017-08-12 | Disposition: A | Discharge status: Home / Self Care | Payer: Self-pay | Source: Ambulatory Visit | Attending: Obstetrics and Gynecology | Admitting: Obstetrics and Gynecology

## 2017-08-12 ENCOUNTER — Ambulatory Visit: Payer: Self-pay

## 2017-08-12 DIAGNOSIS — R928 Other abnormal and inconclusive findings on diagnostic imaging of breast: Secondary | ICD-10-CM

## 2019-03-27 ENCOUNTER — Ambulatory Visit: Payer: BC Managed Care – PPO | Attending: Internal Medicine

## 2019-03-27 DIAGNOSIS — Z20822 Contact with and (suspected) exposure to covid-19: Secondary | ICD-10-CM | POA: Insufficient documentation

## 2019-03-29 LAB — NOVEL CORONAVIRUS, NAA: SARS-CoV-2, NAA: NOT DETECTED

## 2019-05-17 ENCOUNTER — Ambulatory Visit: Payer: BC Managed Care – PPO | Attending: Internal Medicine

## 2019-05-17 DIAGNOSIS — Z20822 Contact with and (suspected) exposure to covid-19: Secondary | ICD-10-CM

## 2019-05-18 LAB — NOVEL CORONAVIRUS, NAA: SARS-CoV-2, NAA: NOT DETECTED

## 2019-06-06 ENCOUNTER — Ambulatory Visit: Payer: BC Managed Care – PPO | Attending: Internal Medicine

## 2019-06-06 DIAGNOSIS — Z20822 Contact with and (suspected) exposure to covid-19: Secondary | ICD-10-CM

## 2019-06-07 LAB — SARS-COV-2, NAA 2 DAY TAT

## 2019-06-07 LAB — NOVEL CORONAVIRUS, NAA: SARS-CoV-2, NAA: NOT DETECTED

## 2020-04-09 IMAGING — MG DIGITAL DIAGNOSTIC UNILATERAL LEFT MAMMOGRAM WITH TOMO AND CAD
4 series · 4 of 12 positions shown · non-contrast
Comparison: Previous exam(s).

CLINICAL DATA: Screening recall for possible left breast mass.

EXAM:
DIGITAL DIAGNOSTIC UNILATERAL LEFT MAMMOGRAM WITH CAD AND TOMO

[L MLO synth-2D]
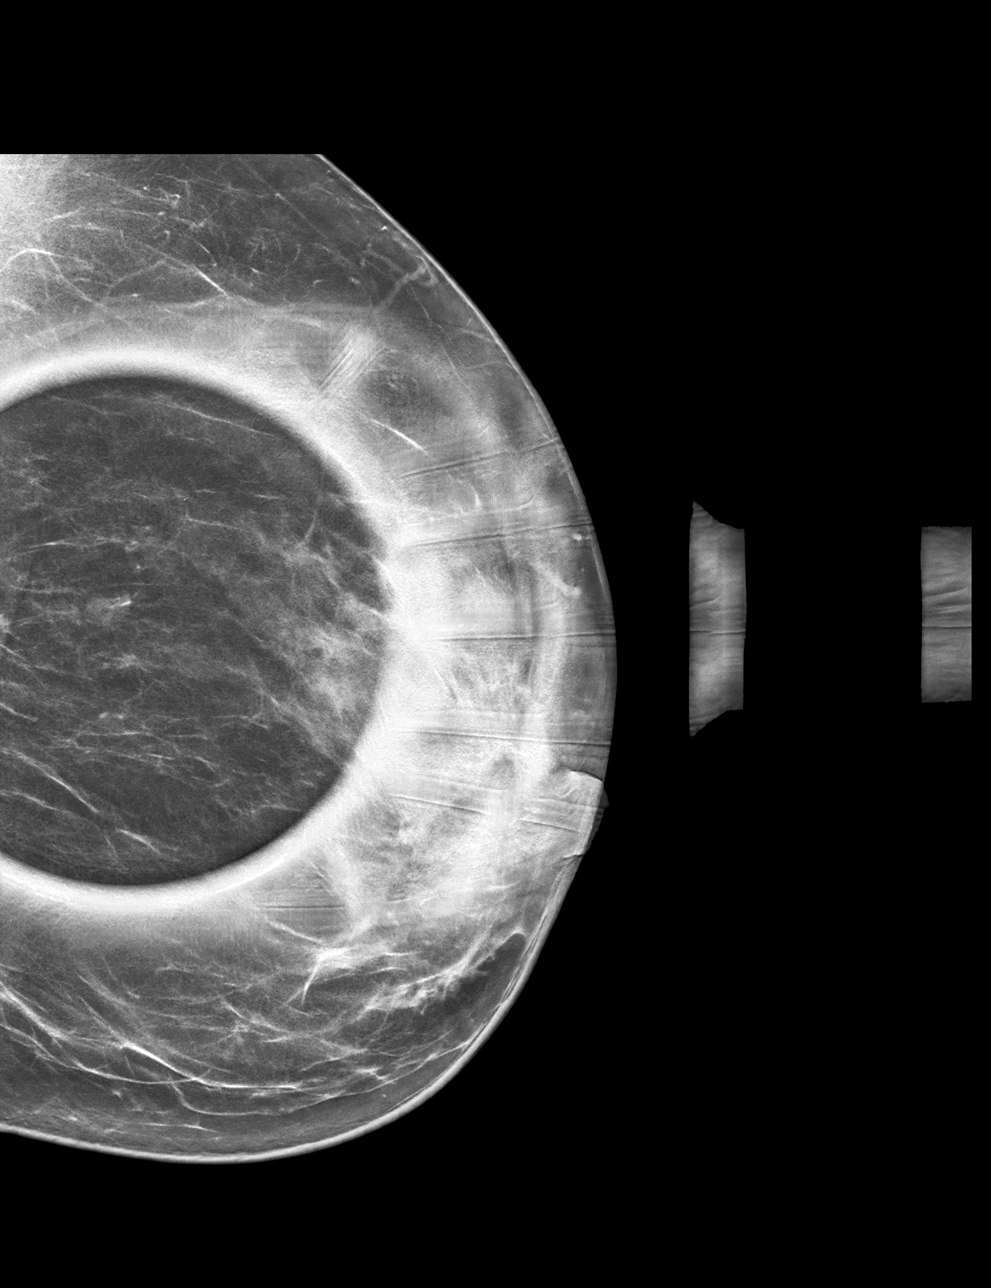

[L CC synth-2D]
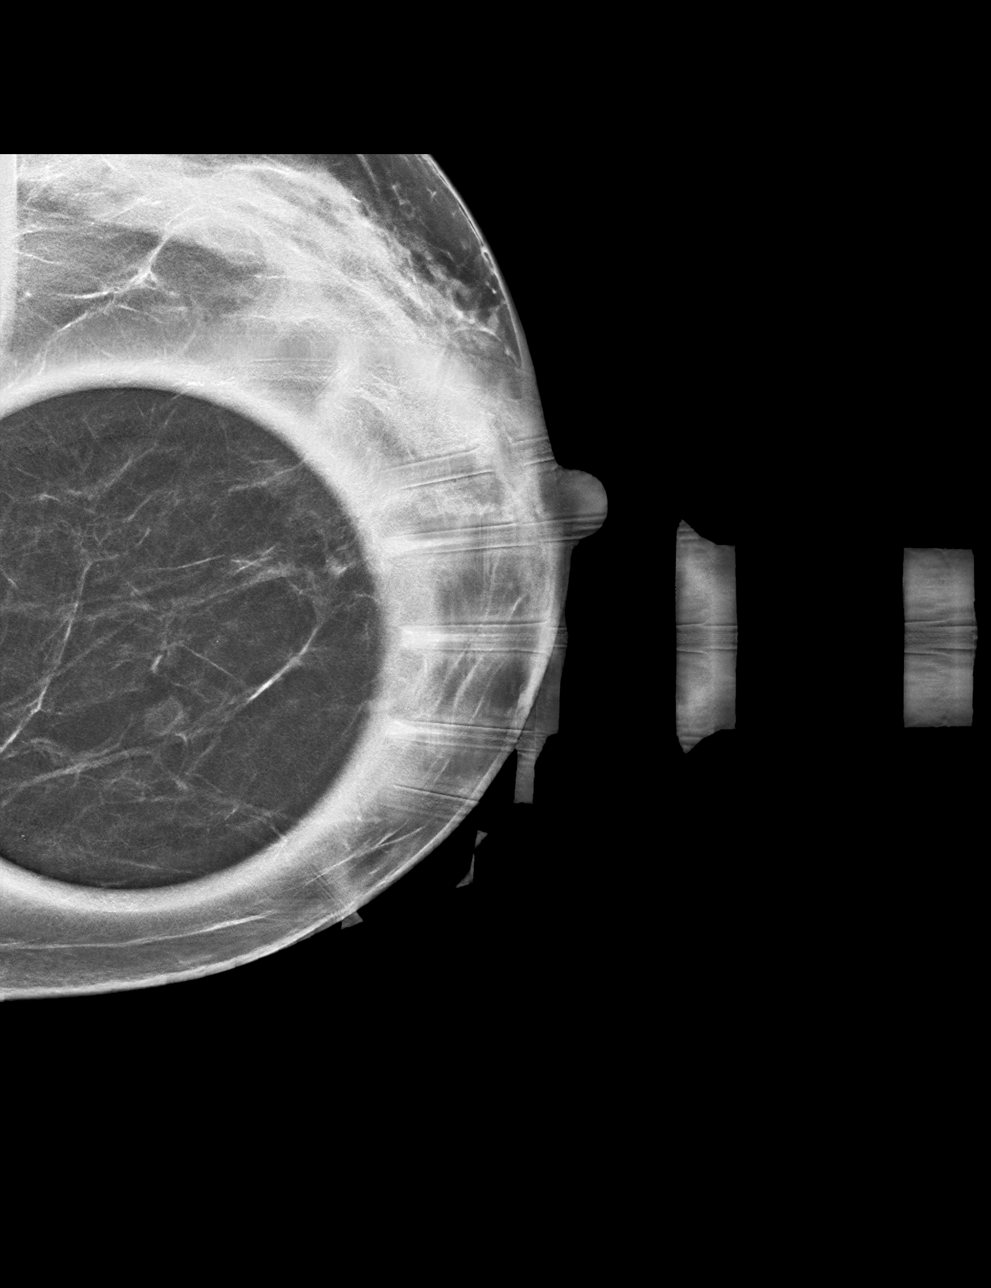

[L CC tomo · tomo slice 29/56.0]
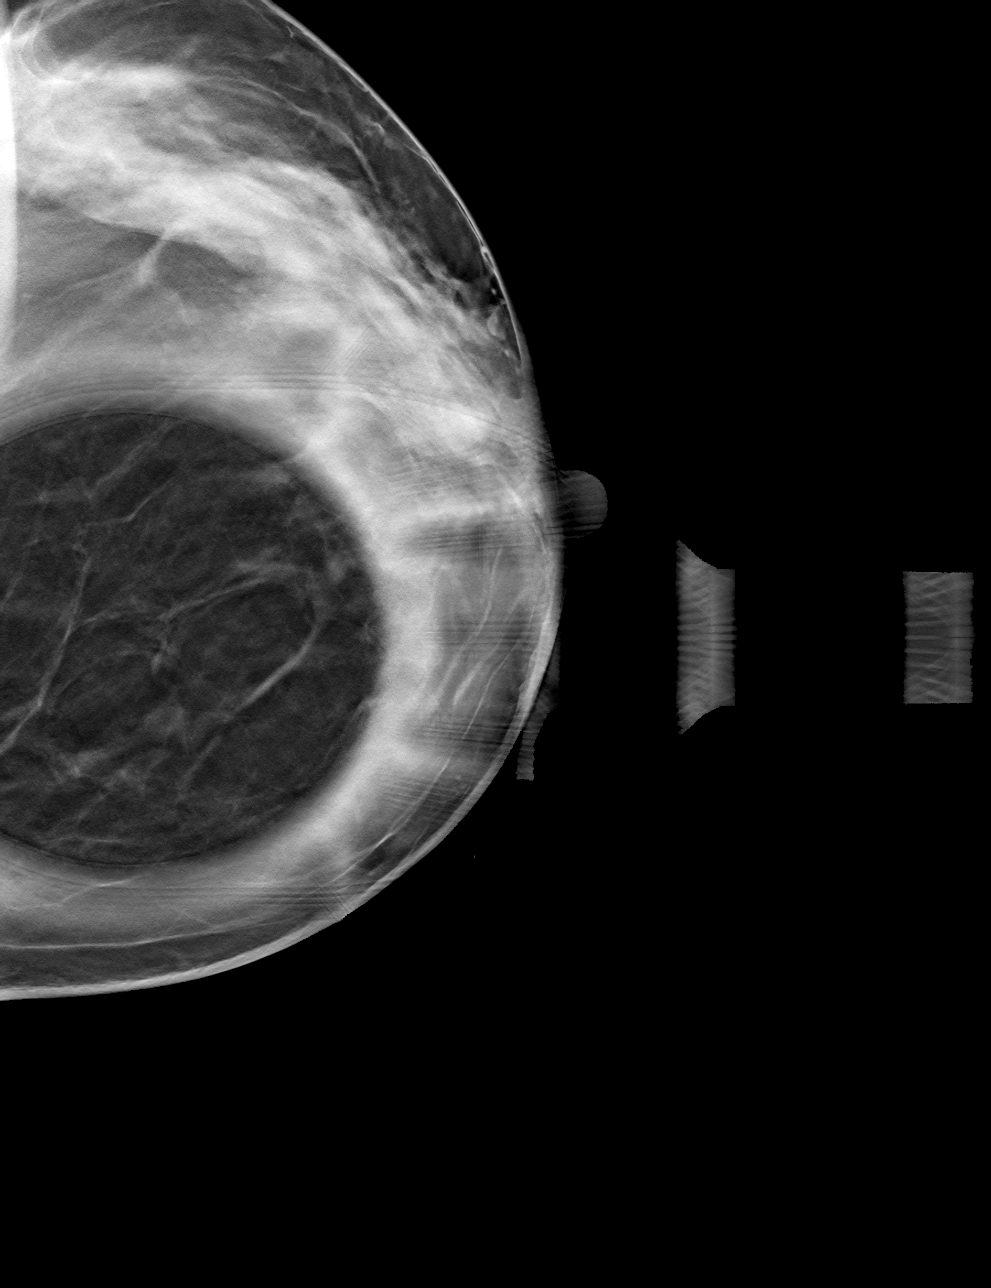

[L MLO tomo · tomo slice 32/63.0]
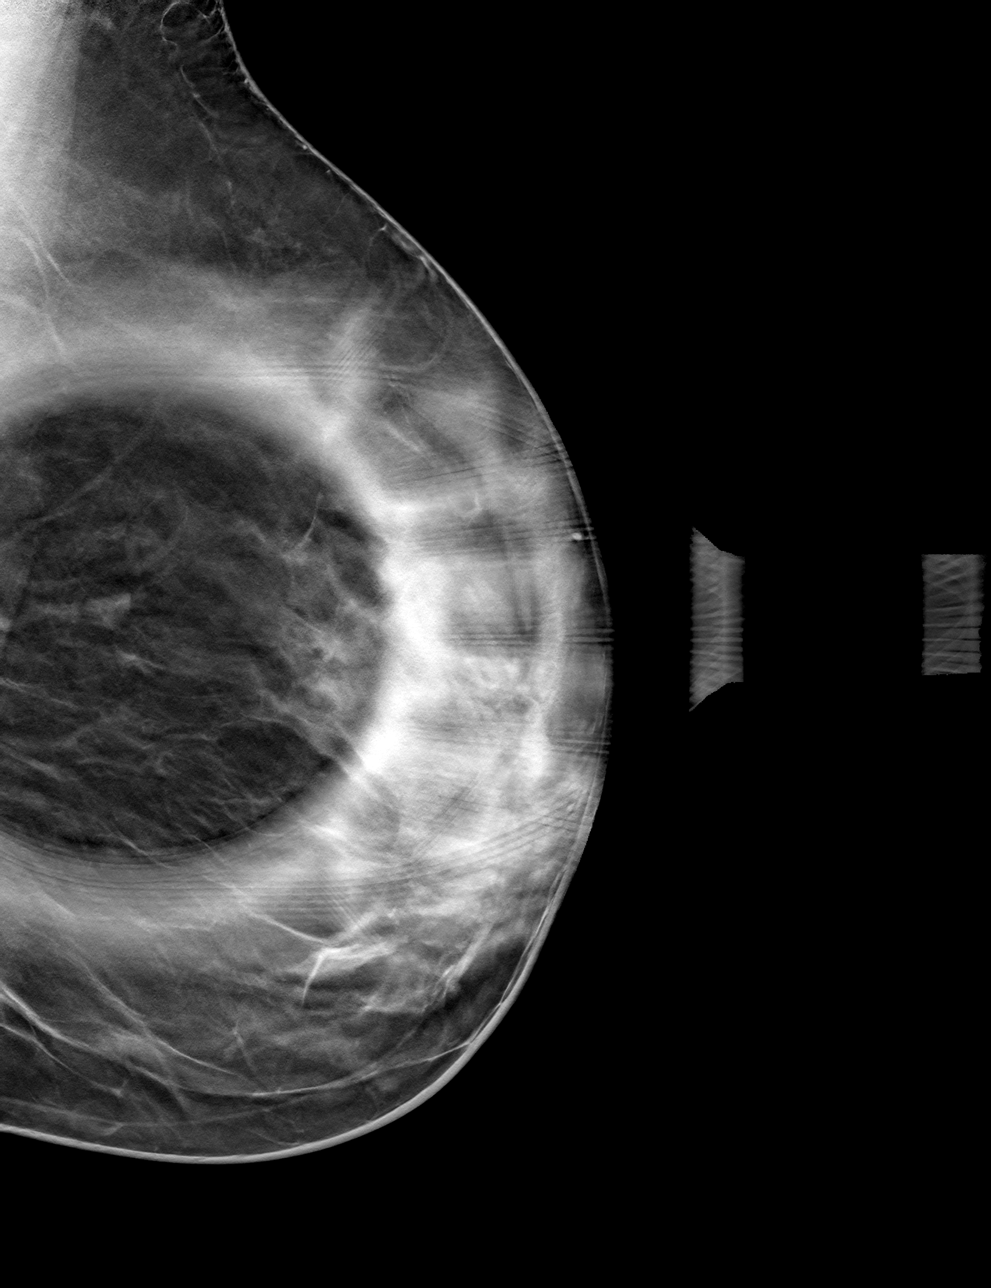

[4 of 12 positions shown; findings below may reference images not displayed]

ACR Breast Density Category c: The breast tissue is heterogeneously
dense, which may obscure small masses.
FINDINGS: Spot compression CC and MLO tomograms were performed over the upper
inner left breast. The oval circumscribed lucent centered nodule is
stable dating back to prior mammograms from [facility]666 and benign in
appearance. There is no mammographic evidence of malignancy in the
left breast.

Mammographic images were processed with CAD.
IMPRESSION: No mammographic evidence of malignancy in the left breast.

RECOMMENDATION:
Screening mammogram in one year.(Code:JP-M-PX7)

I have discussed the findings and recommendations with the patient.
Results were also provided in writing at the conclusion of the
visit. If applicable, a reminder letter will be sent to the patient
regarding the next appointment.

BI-RADS CATEGORY  2: Benign.

## 2020-04-12 ENCOUNTER — Ambulatory Visit: Payer: 59

## 2021-09-11 ENCOUNTER — Other Ambulatory Visit: Payer: Self-pay | Admitting: Family Medicine

## 2021-09-11 DIAGNOSIS — K5900 Constipation, unspecified: Secondary | ICD-10-CM

## 2021-09-11 DIAGNOSIS — R1084 Generalized abdominal pain: Secondary | ICD-10-CM

## 2022-03-23 ENCOUNTER — Encounter: Payer: Self-pay | Admitting: Internal Medicine

## 2022-04-19 ENCOUNTER — Ambulatory Visit
Admission: RE | Admit: 2022-04-19 | Discharge: 2022-04-19 | Disposition: A | Discharge status: Home / Self Care | Payer: 59 | Source: Ambulatory Visit | Attending: Family Medicine | Admitting: Family Medicine

## 2022-04-19 ENCOUNTER — Other Ambulatory Visit: Payer: Self-pay | Admitting: Family Medicine

## 2022-04-19 DIAGNOSIS — M5412 Radiculopathy, cervical region: Secondary | ICD-10-CM

## 2022-04-19 NOTE — Progress Notes (Signed)
2 wks of L sided cervical radiculopathy w/ extension down to fingertips.

## 2022-04-27 ENCOUNTER — Ambulatory Visit: Payer: 59 | Admitting: Internal Medicine

## 2022-04-27 ENCOUNTER — Encounter: Payer: Self-pay | Admitting: Internal Medicine

## 2022-04-27 VITALS — BP 130/82 | HR 73 | Ht 63.0 in | Wt 147.0 lb

## 2022-04-27 DIAGNOSIS — Z1211 Encounter for screening for malignant neoplasm of colon: Secondary | ICD-10-CM | POA: Diagnosis not present

## 2022-04-27 DIAGNOSIS — K589 Irritable bowel syndrome without diarrhea: Secondary | ICD-10-CM | POA: Diagnosis not present

## 2022-04-27 DIAGNOSIS — K5900 Constipation, unspecified: Secondary | ICD-10-CM

## 2022-04-27 MED ORDER — NA SULFATE-K SULFATE-MG SULF 17.5-3.13-1.6 GM/177ML PO SOLN: 1.0000 | Freq: Once | ORAL | 0 refills | Status: AC

## 2022-04-27 MED ORDER — LINACLOTIDE 72 MCG PO CAPS: 72.0000 ug | ORAL_CAPSULE | Freq: Every day | ORAL | 3 refills | Status: DC

## 2022-04-27 NOTE — Progress Notes (Signed)
Chief Complaint: Constipation  HPI : 57 year old female with history of IBS presents with constipation  Over the last 3 months her Miralax stopped working. She was taking Miralax QD, but her stools are still infrequent. She used to follow with Dr. Sharlett Iles, but she never started the [place] 10 years ago because Miralax still worked at that time. Currently she is on average having one BM once every 2-3 days. When she took the Miralax recently, she feels like her stools may have become even more infrequent. She tried probiotics, which didn't help. She isn't taking any fiber supplements, but she does eat oatmeal, which does seem to help. She drinks a lot of water and she is physically active. Denies family history of GI issues. Denies N&V, dysphagia, and GERD. Denies hematochezia or melena. She only has ab pain if she eats certain foods such as broccoli. Last colonoscopy was 10 years ago that was normal. Denies weight loss.  Wt Readings from Last 3 Encounters:  04/27/22 147 lb (66.7 kg)  07/02/12 134 lb (60.8 kg)  06/05/12 140 lb (63.5 kg)   Past Medical History:  Diagnosis Date   Gestational diabetes    IBS (irritable bowel syndrome)    Past Surgical History:  Procedure Laterality Date   CESAREAN SECTION     x 2   TONSILLECTOMY     TUBAL LIGATION     Family History  Problem Relation Age of Onset   Diabetes Mother    Diabetes Maternal Grandmother    Breast cancer Paternal Aunt        x 2   Colon cancer Neg Hx    Stomach cancer Neg Hx    Esophageal cancer Neg Hx    Social History   Tobacco Use   Smoking status: Never   Smokeless tobacco: Never  Vaping Use   Vaping Use: Never used  Substance Use Topics   Alcohol use: Yes    Comment: 1 glass a week   Drug use: No   Current Outpatient Medications  Medication Sig Dispense Refill   lisinopril (ZESTRIL) 20 MG tablet Lisinopril     NON FORMULARY Pt taking steroids for 10 days only     Azelaic Acid (FINACEA) 15 % cream Apply  1 application topically daily. After skin is thoroughly washed and patted dry, gently but thoroughly massage a thin film of azelaic acid cream into the affected area twice daily, in the morning and evening.     DIFFERIN 0.3 % gel Apply 1 application topically at bedtime.      levonorgestrel-ethinyl estradiol (AVIANE) 0.1-20 MG-MCG tablet Take 1 tablet by mouth daily.     psyllium (METAMUCIL) 58.6 % powder Take 1 packet by mouth daily. (Patient not taking: Reported on 04/27/2022)     No current facility-administered medications for this visit.   No Known Allergies   Review of Systems: All systems reviewed and negative except where noted in HPI.   Physical Exam: BP 130/82   Pulse 73   Ht 5' 3"$  (1.6 m)   Wt 147 lb (66.7 kg)   BMI 26.04 kg/m  Constitutional: Pleasant,well-developed, female in no acute distress. HEENT: Normocephalic and atraumatic. Conjunctivae are normal. No scleral icterus. Cardiovascular: Normal rate, regular rhythm.  Pulmonary/chest: Effort normal and breath sounds normal. No wheezing, rales or rhonchi. Abdominal: Soft, nondistended, nontender. Bowel sounds active throughout. There are no masses palpable. No hepatomegaly. Extremities: No edema Neurological: Alert and oriented to person place and time. Skin: Skin is  warm and dry. No rashes noted. Psychiatric: Normal mood and affect. Behavior is normal.  Labs 04/2012: Celiac panel negative.  Colonoscopy 06/05/12:   ASSESSMENT AND PLAN: Constipation IBS Colon cancer screening Patient presents with worsened constipation over the last 3 months that has stopped responding to Miralax therapy. Thus will plan to start her on Linzess and a daily fiber supplement to see if this keeps her constipation under better control. She does note that certain foods seem to cause her to develop abdominal pain (likely related to IBS) so will give her a handout on the low FODMAP diet to aid with identifying potential trigger foods. Since  the patient's last colonoscopy was 10 years ago, will plan for a repeat colonoscopy for colon cancer screening. - Low FODMAP diet - Start daily fiber supplement - Start Linzess 72 mcg QD - Colonoscopy [facility]  Christia Reading, MD  I spent 48 minutes of time, including in depth chart review, independent review of results as outlined above, communicating results with the patient directly, face-to-face time with the patient, coordinating care, ordering studies and medications as appropriate, and documentation.

## 2022-04-27 NOTE — Patient Instructions (Addendum)
_______________________________________________________  If your blood pressure at your visit was 140/90 or greater, please contact your primary care physician to follow up on this.  _______________________________________________________  If you are age 55 or older, your body mass index should be between 23-30. Your Body mass index is 26.04 kg/m. If this is out of the aforementioned range listed, please consider follow up with your Primary Care Provider.  If you are age 70 or younger, your body mass index should be between 19-25. Your Body mass index is 26.04 kg/m. If this is out of the aformentioned range listed, please consider follow up with your Primary Care Provider.   ________________________________________________________  The [facility] providers would like to encourage you to use [facility] to communicate with providers for non-urgent requests or questions.  Due to long hold times on the telephone, sending your provider a message by [facility] may be a faster and more efficient way to get a response.  Please allow 48 business hours for a response.  Please remember that this is for non-urgent requests.  _______________________________________________________  Megan Cuevas have been scheduled for a colonoscopy. Please follow written instructions given to you at your visit today.  Please pick up your prep supplies at the pharmacy within the next 1-3 days. If you use inhalers (even only as needed), please bring them with you on the day of your procedure.  We have sent the following medications to your pharmacy for you to pick up at your convenience:  Linzess  Linzess works best when taken once a day every day, on an empty stomach, at least 30 minutes before your first meal of the day.  When Linzess is taken daily as directed:  *Constipation relief is typically felt in about a week *IBS-C patients may begin to experience relief from belly pain and overall abdominal symptoms (pain, discomfort, and  bloating) in about 1 week,   with symptoms typically improving over 12 weeks.  Diarrhea may occur in the first 2 weeks -keep taking it.  The diarrhea should go away and you should start having normal, complete, full bowel movements. It may be helpful to start treatment when you can be near the comfort of your own bathroom, such as a weekend.    Take daily fiber  FODMAP diet

## 2022-05-06 ENCOUNTER — Other Ambulatory Visit: Payer: Self-pay | Admitting: Internal Medicine

## 2022-05-06 DIAGNOSIS — M5412 Radiculopathy, cervical region: Secondary | ICD-10-CM

## 2022-05-07 ENCOUNTER — Other Ambulatory Visit: Payer: Self-pay | Admitting: Internal Medicine

## 2022-05-07 DIAGNOSIS — M5412 Radiculopathy, cervical region: Secondary | ICD-10-CM

## 2022-05-18 ENCOUNTER — Encounter: Payer: Self-pay | Admitting: Internal Medicine

## 2022-05-28 ENCOUNTER — Ambulatory Visit
Admission: RE | Admit: 2022-05-28 | Discharge: 2022-05-28 | Disposition: A | Discharge status: Home / Self Care | Payer: 59 | Source: Ambulatory Visit | Attending: Internal Medicine | Admitting: Internal Medicine

## 2022-05-28 DIAGNOSIS — M5412 Radiculopathy, cervical region: Secondary | ICD-10-CM

## 2022-05-28 MED ORDER — GADOPICLENOL 0.5 MMOL/ML IV SOLN
7.5000 mL | Freq: Once | INTRAVENOUS | Status: AC | PRN
  Administered 2022-05-28: 7.5 mL via INTRAVENOUS

## 2022-06-29 ENCOUNTER — Encounter: Payer: 59 | Admitting: Internal Medicine

## 2022-07-14 ENCOUNTER — Ambulatory Visit (AMBULATORY_SURGERY_CENTER): Payer: 59

## 2022-07-14 VITALS — Ht 63.0 in | Wt 143.0 lb

## 2022-07-14 DIAGNOSIS — K589 Irritable bowel syndrome without diarrhea: Secondary | ICD-10-CM

## 2022-07-14 DIAGNOSIS — Z1211 Encounter for screening for malignant neoplasm of colon: Secondary | ICD-10-CM

## 2022-07-14 DIAGNOSIS — K5900 Constipation, unspecified: Secondary | ICD-10-CM

## 2022-07-14 NOTE — Progress Notes (Signed)
Pre visit completed via phone call; Patient verified name, DOB, and address;  No egg or soy allergy known to patient;  No issues known to pt with past sedation with any surgeries or procedures; Patient denies ever being told they had issues or difficulty with intubation; No FH of Malignant Hyperthermia; Pt is not on diet pills; Pt is not on home 02;  Pt is not on blood thinners;  Pt denies issues with constipation at this time- patient reports she has changed her diet and no longer takes Metamucil and is having a BM every 2-3 days-patient advised to increase oral fluids, fruits/veggies, and activity (as tolerated); patient also advised to take OTC Miralax/Dulcolax/stool softener as/if needed;   No A fib or A flutter; Have any cardiac testing pending--NO Pt instructed to use Single[employer]com or GoodRx for a price reduction on prep;   Insurance verified during PV appt=[employer]  Patient's chart reviewed by Cathlyn Parsons CNRA prior to previsit and patient appropriate for the [facility].  Previsit completed and red dot placed by patient's name on their procedure day (on provider's schedule).    Patient reports she has already picked up her Suprep and Miralax from the pharmacy;

## 2022-07-22 ENCOUNTER — Encounter: Payer: Self-pay | Admitting: Internal Medicine

## 2022-08-03 ENCOUNTER — Ambulatory Visit (AMBULATORY_SURGERY_CENTER): Payer: 59 | Admitting: Internal Medicine

## 2022-08-03 ENCOUNTER — Encounter: Payer: Self-pay | Admitting: Internal Medicine

## 2022-08-03 VITALS — BP 147/94 | HR 79 | Temp 98.0°F | Resp 23 | Ht 63.0 in | Wt 143.0 lb

## 2022-08-03 DIAGNOSIS — D120 Benign neoplasm of cecum: Secondary | ICD-10-CM | POA: Diagnosis not present

## 2022-08-03 DIAGNOSIS — K589 Irritable bowel syndrome without diarrhea: Secondary | ICD-10-CM

## 2022-08-03 DIAGNOSIS — Z1211 Encounter for screening for malignant neoplasm of colon: Secondary | ICD-10-CM

## 2022-08-03 DIAGNOSIS — D123 Benign neoplasm of transverse colon: Secondary | ICD-10-CM | POA: Diagnosis not present

## 2022-08-03 MED ORDER — SODIUM CHLORIDE 0.9 % IV SOLN
500.0000 mL | INTRAVENOUS | Status: DC
Start: 2022-08-03 — End: 2022-08-03

## 2022-08-03 NOTE — Op Note (Signed)
[facility] Patient Name: Megan Cuevas Procedure Date: 08/03/2022 8:07 AM MRN: [id number] Endoscopist: Madelyn Brunner Blevins , , [id number] Age: 56 Referring MD:  Date of Birth: Jul 20, 1965 Gender: Female Account #: [id number] Procedure:                Colonoscopy Indications:              Screening for colorectal malignant neoplasm Medicines:                Monitored Anesthesia Care Procedure:                Pre-Anesthesia Assessment:                           - Prior to the procedure, a History and Physical                            was performed, and patient medications and                            allergies were reviewed. The patient's tolerance of                            previous anesthesia was also reviewed. The risks                            and benefits of the procedure and the sedation                            options and risks were discussed with the patient.                            All questions were answered, and informed consent                            was obtained. Prior Anticoagulants: The patient has                            taken no anticoagulant or antiplatelet agents. ASA                            Grade Assessment: II - A patient with mild systemic                            disease. After reviewing the risks and benefits,                            the patient was deemed in satisfactory condition to                            undergo the procedure.                           After obtaining informed consent, the colonoscope  was passed under direct vision. Throughout the                            procedure, the patient's blood pressure, pulse, and                            oxygen saturations were monitored continuously. The                            Olympus PCF-H190DL (#[id number]) Colonoscope was                            introduced through the anus and advanced to the the                            cecum,  identified by appendiceal orifice and                            ileocecal valve. The colonoscopy was performed                            without difficulty. The patient tolerated the                            procedure well. The quality of the bowel                            preparation was good. The terminal ileum, ileocecal                            valve, appendiceal orifice, and rectum were                            photographed. Scope In: 8:11:16 AM Scope Out: 8:32:49 AM Scope Withdrawal Time: 0 hours 13 minutes 37 seconds  Total Procedure Duration: 0 hours 21 minutes 33 seconds  Findings:                 The terminal ileum appeared normal.                           Two sessile polyps were found in the transverse                            colon and cecum. The polyps were 4 to 7 mm in size.                            These polyps were removed with a cold snare.                            Resection and retrieval were complete.                           A localized area of mildly erythematous mucosa was  found in the rectum. This was biopsied with a cold                            forceps.                           Non-bleeding internal hemorrhoids were found during                            retroflexion. Complications:            No immediate complications. Estimated Blood Loss:     Estimated blood loss was minimal. Impression:               - The examined portion of the ileum was normal.                           - Two 4 to 7 mm polyps in the transverse colon and                            in the cecum, removed with a cold snare. Resected                            and retrieved.                           - Erythematous mucosa in the rectum. Biopsied.                           - Non-bleeding internal hemorrhoids. Recommendation:           - Discharge patient to home (with escort).                           - Await pathology results.                            - The findings and recommendations were discussed                            with the patient. Dr Particia Lather "Alan Ripper" Leonides Schanz,  08/03/2022 8:40:26 AM

## 2022-08-03 NOTE — Patient Instructions (Signed)
Handout on polyps and hemorrhoids given to patient. Await pathology results. Resume previous diet and continue present medications Repeat colonoscopy for surveillance will be determined based off of pathology results.   YOU HAD AN ENDOSCOPIC PROCEDURE TODAY AT THE [facility]:   Refer to the procedure report that was given to you for any specific questions about what was found during the examination.  If the procedure report does not answer your questions, please call your gastroenterologist to clarify.  If you requested that your care partner not be given the details of your procedure findings, then the procedure report has been included in a sealed envelope for you to review at your convenience later.  YOU SHOULD EXPECT: Some feelings of bloating in the abdomen. Passage of more gas than usual.  Walking can help get rid of the air that was put into your GI tract during the procedure and reduce the bloating. If you had a lower endoscopy (such as a colonoscopy or flexible sigmoidoscopy) you may notice spotting of blood in your stool or on the toilet paper. If you underwent a bowel prep for your procedure, you may not have a normal bowel movement for a few days.  Please Note:  You might notice some irritation and congestion in your nose or some drainage.  This is from the oxygen used during your procedure.  There is no need for concern and it should clear up in a day or so.  SYMPTOMS TO REPORT IMMEDIATELY:  Following lower endoscopy (colonoscopy or flexible sigmoidoscopy):  Excessive amounts of blood in the stool  Significant tenderness or worsening of abdominal pains  Swelling of the abdomen that is new, acute  Fever of 100F or higher  For urgent or emergent issues, a gastroenterologist can be reached at any hour by calling [phone]. Do not use MyChart messaging for urgent concerns.    DIET:  We do recommend a small meal at first, but then you may proceed to your regular  diet.  Drink plenty of fluids but you should avoid alcoholic beverages for 24 hours.  ACTIVITY:  You should plan to take it easy for the rest of today and you should NOT DRIVE or use heavy machinery until tomorrow (because of the sedation medicines used during the test).    FOLLOW UP: Our staff will call the number listed on your records the next business day following your procedure.  We will call around 7:15- 8:00 am to check on you and address any questions or concerns that you may have regarding the information given to you following your procedure. If we do not reach you, we will leave a message.     If any biopsies were taken you will be contacted by phone or by letter within the next 1-3 weeks.  Please call us at [phone] if you have not heard about the biopsies in 3 weeks.    SIGNATURES/CONFIDENTIALITY: You and/or your care partner have signed paperwork which will be entered into your electronic medical record.  These signatures attest to the fact that that the information above on your After Visit Summary has been reviewed and is understood.  Full responsibility of the confidentiality of this discharge information lies with you and/or your care-partner. 

## 2022-08-03 NOTE — Progress Notes (Signed)
GASTROENTEROLOGY PROCEDURE H&P NOTE   Primary Care Physician: Ozella Rocks, MD    Reason for Procedure:   Colon cancer screening  Plan:    Colonoscopy  Patient is appropriate for endoscopic procedure(s) in the ambulatory ([facility]) setting.  The nature of the procedure, as well as the risks, benefits, and alternatives were carefully and thoroughly reviewed with the patient. Ample time for discussion and questions allowed. The patient understood, was satisfied, and agreed to proceed.     HPI: Megan Cuevas is a 57 y.o. female who presents for colonoscopy for evaluation of colon cancer screening.  Patient was most recently seen in the Gastroenterology Clinic on 04/27/22.  No interval change in medical history since that appointment. Please refer to that note for full details regarding GI history and clinical presentation.   Past Medical History:  Diagnosis Date   Hypertension    on meds   IBS (irritable bowel syndrome)     Past Surgical History:  Procedure Laterality Date   CESAREAN SECTION     x 2   COLONOSCOPY  2014   DP-MAC-moviprep(exc)-normal   TONSILLECTOMY     TUBAL LIGATION      Prior to Admission medications   Medication Sig Start Date End Date Taking? Authorizing Provider  adapalene (DIFFERIN) 0.1 % cream Apply 0.1 % topically daily.   Yes [provider]  Azelaic Acid 15 % gel Apply 1 Application topically daily. COMPOUNDED FACIAL MEDICATION FROM DERMATOLOGY   Yes [provider]  estradiol (ESTRACE) 1 MG tablet Take 1 mg by mouth daily. 0.5 to 1 mg daily   Yes [provider]  estradiol-norethindrone (ACTIVELLA) 1-0.5 MG tablet Take 1 tablet by mouth daily. 07/29/22  Yes [provider]  lisinopril (ZESTRIL) 20 MG tablet Take 20 mg by mouth daily.   Yes [provider]  Multiple Vitamin (MULTIVITAMIN) capsule Take 1 capsule by mouth daily.   Yes [provider]  linaclotide Karlene Einstein) 72 MCG capsule Take  1 capsule (72 mcg total) by mouth daily before breakfast. Patient not taking: Reported on 07/14/2022 04/27/22   Imogene Burn, MD    Current Outpatient Medications  Medication Sig Dispense Refill   adapalene (DIFFERIN) 0.1 % cream Apply 0.1 % topically daily.     Azelaic Acid 15 % gel Apply 1 Application topically daily. COMPOUNDED FACIAL MEDICATION FROM DERMATOLOGY     estradiol (ESTRACE) 1 MG tablet Take 1 mg by mouth daily. 0.5 to 1 mg daily     estradiol-norethindrone (ACTIVELLA) 1-0.5 MG tablet Take 1 tablet by mouth daily.     lisinopril (ZESTRIL) 20 MG tablet Take 20 mg by mouth daily.     Multiple Vitamin (MULTIVITAMIN) capsule Take 1 capsule by mouth daily.     linaclotide (LINZESS) 72 MCG capsule Take 1 capsule (72 mcg total) by mouth daily before breakfast. (Patient not taking: Reported on 07/14/2022) 90 capsule 3   Current Facility-Administered Medications  Medication Dose Route Frequency Provider Last Rate Last Admin   0.9 %  sodium chloride infusion  500 mL Intravenous Continuous Imogene Burn, MD        Allergies as of 08/03/2022   (No Known Allergies)    Family History  Problem Relation Age of Onset   Diabetes Mother    Breast cancer Paternal Aunt        x 2   Diabetes Maternal Grandmother    Colon cancer Neg Hx    Stomach cancer Neg Hx  Esophageal cancer Neg Hx    Colon polyps Neg Hx    Rectal cancer Neg Hx     Social History   Socioeconomic History   Marital status: Married    Spouse name: Not on file   Number of children: 2   Years of education: Not on file   Highest education level: Not on file  Occupational History   Occupation: ENGINEER    Employer: [employer]   Occupation: Art gallery manager  Tobacco Use   Smoking status: Never   Smokeless tobacco: Never  Vaping Use   Vaping Use: Never used  Substance and Sexual Activity   Alcohol use: Yes    Alcohol/week: 1.0 standard drink of alcohol    Types: 1 Glasses of wine per week    Comment: 1 glass a week    Drug use: No   Sexual activity: Not on file  Other Topics Concern   Not on file  Social History Narrative   Not on file   Social Determinants of Health   Financial Resource Strain: Not on file  Food Insecurity: Not on file  Transportation Needs: Not on file  Physical Activity: Not on file  Stress: Not on file  Social Connections: Not on file  Intimate Partner Violence: Not on file    Physical Exam: Vital signs in last 24 hours: BP 138/82   Pulse 83   Temp 98 F (36.7 C)   Ht 5\' 3"  (1.6 m)   Wt 143 lb (64.9 kg)   SpO2 99%   BMI 25.33 kg/m  GEN: NAD EYE: Sclerae anicteric ENT: MMM CV: Non-tachycardic Pulm: No increased WOB GI: Soft NEURO:  Alert & Oriented   Eulah Pont, MD [facility]   08/03/2022 8:04 AM

## 2022-08-03 NOTE — Progress Notes (Signed)
Called to room to assist during endoscopic procedure.  Patient ID and intended procedure confirmed with present staff. Received instructions for my participation in the procedure from the performing physician.  

## 2022-08-03 NOTE — Progress Notes (Signed)
Report to PACU, RN, vss, BBS= Clear.  

## 2022-08-04 ENCOUNTER — Telehealth: Payer: Self-pay

## 2022-08-04 NOTE — Telephone Encounter (Signed)
Post procedure follow up call, no answer 

## 2022-08-06 ENCOUNTER — Encounter: Payer: Self-pay | Admitting: Internal Medicine

## 2022-10-13 ENCOUNTER — Other Ambulatory Visit: Payer: Self-pay

## 2022-10-13 ENCOUNTER — Emergency Department (HOSPITAL_COMMUNITY)
Admission: EM | Admit: 2022-10-13 | Discharge: 2022-10-13 | Disposition: A | Discharge status: Home / Self Care | Payer: 59

## 2022-10-13 ENCOUNTER — Encounter (HOSPITAL_COMMUNITY): Payer: Self-pay

## 2022-10-13 ENCOUNTER — Emergency Department (HOSPITAL_COMMUNITY): Payer: 59

## 2022-10-13 DIAGNOSIS — R002 Palpitations: Secondary | ICD-10-CM | POA: Diagnosis present

## 2022-10-13 DIAGNOSIS — R0602 Shortness of breath: Secondary | ICD-10-CM | POA: Insufficient documentation

## 2022-10-13 DIAGNOSIS — R0789 Other chest pain: Secondary | ICD-10-CM | POA: Insufficient documentation

## 2022-10-13 LAB — CBC
HCT: 41.8 % (ref 36.0–46.0)
Hemoglobin: 13.5 g/dL (ref 12.0–15.0)
MCH: 27.4 pg (ref 26.0–34.0)
MCHC: 32.3 g/dL (ref 30.0–36.0)
MCV: 85 fL (ref 80.0–100.0)
Platelets: 400 10*3/uL (ref 150–400)
RBC: 4.92 MIL/uL (ref 3.87–5.11)
RDW: 12.7 % (ref 11.5–15.5)
WBC: 7.9 10*3/uL (ref 4.0–10.5)
nRBC: 0 % (ref 0.0–0.2)

## 2022-10-13 LAB — BASIC METABOLIC PANEL
Anion gap: 11 (ref 5–15)
BUN: 19 mg/dL (ref 6–20)
CO2: 18 mmol/L — ABNORMAL LOW (ref 22–32)
Calcium: 8.8 mg/dL — ABNORMAL LOW (ref 8.9–10.3)
Chloride: 106 mmol/L (ref 98–111)
Creatinine, Ser: 1.1 mg/dL — ABNORMAL HIGH (ref 0.44–1.00)
GFR, Estimated: 60 mL/min — ABNORMAL LOW (ref >60)
Glucose, Bld: 107 mg/dL — ABNORMAL HIGH (ref 70–99)
Potassium: 4 mmol/L (ref 3.5–5.1)
Sodium: 135 mmol/L (ref 135–145)

## 2022-10-13 LAB — TROPONIN I (HIGH SENSITIVITY)
Troponin I (High Sensitivity): 5 ng/L (ref <18)
Troponin I (High Sensitivity): 3 ng/L (ref <18)

## 2022-10-13 NOTE — Discharge Instructions (Signed)
You are seen in the emergency department for palpitations.  Your vital signs, physical exam, lab work, and imaging were all reassuring.  As such we do not feel that she have any life-threatening or emergent injuries or illnesses that require intervention at this time.  You are safe to follow-up with your primary doctor.  Please do so soon as possible.  Please discuss possible Holter monitor for arrhythmia or symptomatic premature ventricular contractions.  Return immediately if develop fevers, chills, chest pain, shortness of breath, lightheadedness, passout or any new or worsening symptoms that are concerning to you.

## 2022-10-13 NOTE — ED Provider Notes (Signed)
Megan Cuevas Provider Note   CSN: [id number] Arrival date & time: 10/13/22  0422     History  Chief Complaint  Patient presents with   Palpitations    Patient awoke from sleep with feeling her heart was racing watch showed hr in 120.s     Megan Cuevas is a 57 y.o. female.  This is a 57 year old female presented to the emergency department with chief complaint of palpitations.  She woke from sleep tonight felt like her heart was racing, had some chest pressure and some shortness of breath.  Symptoms lasted 15 to 30 seconds, but had several episodes over the course of about 10 minutes.  Currently states that she is feeling better.  No longer having chest pain   Palpitations      Home Medications Prior to Admission medications   Medication Sig Start Date End Date Taking? Authorizing Provider  adapalene (DIFFERIN) 0.1 % cream Apply 0.1 % topically daily.    [provider]  Azelaic Acid 15 % gel Apply 1 Application topically daily. COMPOUNDED FACIAL MEDICATION FROM DERMATOLOGY    [provider]  estradiol (ESTRACE) 1 MG tablet Take 1 mg by mouth daily. 0.5 to 1 mg daily    [provider]  estradiol-norethindrone (ACTIVELLA) 1-0.5 MG tablet Take 1 tablet by mouth daily. 07/29/22   [provider]  linaclotide Karlene Einstein) 72 MCG capsule Take 1 capsule (72 mcg total) by mouth daily before breakfast. Patient not taking: Reported on 07/14/2022 04/27/22   Megan Burn, MD  lisinopril (ZESTRIL) 20 MG tablet Take 20 mg by mouth daily.    [provider]  Multiple Vitamin (MULTIVITAMIN) capsule Take 1 capsule by mouth daily.    [provider]      Allergies    Patient has no known allergies.    Review of Systems   Review of Systems  Cardiovascular:  Positive for palpitations.    Physical Exam Updated Vital Signs BP 125/86   Pulse 60   Temp 98.6 F (37 C) (Oral)   Resp 17   Ht  5\' 3"  (1.6 m)   Wt 68 kg   LMP 05/29/2013   SpO2 100%   BMI 26.57 kg/m  Physical Exam Vitals and nursing note reviewed.  HENT:     Nose: Nose normal.     Mouth/Throat:     Mouth: Mucous membranes are moist.  Eyes:     Conjunctiva/sclera: Conjunctivae normal.  Cardiovascular:     Rate and Rhythm: Normal rate and regular rhythm.  Pulmonary:     Effort: Pulmonary effort is normal.     Breath sounds: Normal breath sounds.  Abdominal:     General: Abdomen is flat. There is no distension.     Tenderness: There is no abdominal tenderness. There is no guarding or rebound.  Musculoskeletal:        General: No tenderness. Normal range of motion.     Cervical back: Normal range of motion.     Right lower leg: No edema.     Left lower leg: No edema.  Skin:    General: Skin is warm and dry.     Capillary Refill: Capillary refill takes less than 2 seconds.  Neurological:     General: No focal deficit present.     Mental Status: She is alert.  Psychiatric:        Mood and Affect: Mood normal.  Behavior: Behavior normal.     ED Results / Procedures / Treatments   Labs (all labs ordered are listed, but only abnormal results are displayed) Labs Reviewed  BASIC METABOLIC PANEL - Abnormal; Notable for the following components:      Result Value   CO2 18 (*)    Glucose, Bld 107 (*)    Creatinine, Ser 1.10 (*)    Calcium 8.8 (*)    GFR, Estimated 60 (*)    All other components within normal limits  CBC  TROPONIN I (HIGH SENSITIVITY)  TROPONIN I (HIGH SENSITIVITY)    EKG EKG Interpretation Date/Time:  Wednesday October 13 2022 04:05:49 EDT Ventricular Rate:  97 PR Interval:  152 QRS Duration:  60 QT Interval:  342 QTC Calculation: 434 R Axis:   77  Text Interpretation: Sinus rhythm with occasional Premature ventricular complexes Septal infarct , age undetermined Abnormal ECG When compared with ECG of 23-Aug-2012 15:10, Premature ventricular complexes are now present  Confirmed by Dione Booze ([id number]) on 10/13/2022 5:22:09 AM  Radiology DG Chest 2 View  Result Date: 10/13/2022 CLINICAL DATA:  Palpitations. EXAM: CHEST - 2 VIEW COMPARISON:  09/22/2012 FINDINGS: Normal heart size and mediastinal contours. No acute infiltrate or edema. Mild biapical pleural based scarring. No effusion or pneumothorax. No acute osseous findings. Artifact from EKG leads. IMPRESSION: Negative chest. Electronically Signed   By: Tiburcio Pea M.D.   On: 10/13/2022 05:31    Procedures Procedures    Medications Ordered in ED Medications - No data to display  ED Course/ Medical Decision Making/ A&P                                 Medical Decision Making This is a well-appearing 57 year old female presenting emergency department for palpitations.  She is afebrile nontachycardic normotensive.  She is normal sinus rhythm heart rate in the 60s.  EKG with singular premature ventricular contraction, but no ST segment changes indicate ischemia.  Negative vomiting x 2.  ACS unlikely.  She is low risk based on Wells criteria for PE.  Chest x-ray negative for pneumonia or pneumothorax.  She has no significant metabolic derangements.  Normal kidney function.  No fever tachycardia leukocytosis to suggest systemic infection.  She is asymptomatic at this time. Discussed follow-up with PCP for Holter monitor for suspected arrhythmia.  Patient agreeable to plan.  She was given strict return precautions.  Amount and/or Complexity of Data Reviewed Independent Historian: spouse    Details: Noted she seemed quite anxious External Data Reviewed: notes.    Details: Recent Bartholin gland cyst, put on Bactrim Labs: ordered. Decision-making details documented in ED Course. Radiology: ordered. Decision-making details documented in ED Course.         Final Clinical Impression(s) / ED Diagnoses Final diagnoses:  Palpitations    Rx / DC Orders ED Discharge Orders     None          Coral Spikes, DO 10/13/22 1619

## 2022-11-09 ENCOUNTER — Emergency Department (HOSPITAL_BASED_OUTPATIENT_CLINIC_OR_DEPARTMENT_OTHER)
Admission: EM | Admit: 2022-11-09 | Discharge: 2022-11-09 | Disposition: A | Discharge status: Home / Self Care | Payer: 59 | Attending: Emergency Medicine | Admitting: Emergency Medicine

## 2022-11-09 ENCOUNTER — Other Ambulatory Visit: Payer: Self-pay

## 2022-11-09 ENCOUNTER — Telehealth: Payer: Self-pay | Admitting: Internal Medicine

## 2022-11-09 ENCOUNTER — Encounter (HOSPITAL_BASED_OUTPATIENT_CLINIC_OR_DEPARTMENT_OTHER): Payer: Self-pay

## 2022-11-09 ENCOUNTER — Other Ambulatory Visit (INDEPENDENT_AMBULATORY_CARE_PROVIDER_SITE_OTHER): Payer: 59

## 2022-11-09 DIAGNOSIS — R200 Anesthesia of skin: Secondary | ICD-10-CM | POA: Insufficient documentation

## 2022-11-09 DIAGNOSIS — Z79899 Other long term (current) drug therapy: Secondary | ICD-10-CM | POA: Diagnosis not present

## 2022-11-09 DIAGNOSIS — I10 Essential (primary) hypertension: Secondary | ICD-10-CM | POA: Diagnosis not present

## 2022-11-09 DIAGNOSIS — R002 Palpitations: Secondary | ICD-10-CM

## 2022-11-09 LAB — CBC WITH DIFFERENTIAL/PLATELET
Abs Immature Granulocytes: 0.01 10*3/uL (ref 0.00–0.07)
Basophils Absolute: 0.1 10*3/uL (ref 0.0–0.1)
Basophils Relative: 1 %
Eosinophils Absolute: 0.2 10*3/uL (ref 0.0–0.5)
Eosinophils Relative: 3 %
HCT: 40.4 % (ref 36.0–46.0)
Hemoglobin: 13.2 g/dL (ref 12.0–15.0)
Immature Granulocytes: 0 %
Lymphocytes Relative: 46 %
Lymphs Abs: 2.2 10*3/uL (ref 0.7–4.0)
MCH: 27.8 pg (ref 26.0–34.0)
MCHC: 32.7 g/dL (ref 30.0–36.0)
MCV: 85.1 fL (ref 80.0–100.0)
Monocytes Absolute: 0.5 10*3/uL (ref 0.1–1.0)
Monocytes Relative: 10 %
Neutro Abs: 1.9 10*3/uL (ref 1.7–7.7)
Neutrophils Relative %: 40 %
Platelets: 307 10*3/uL (ref 150–400)
RBC: 4.75 MIL/uL (ref 3.87–5.11)
RDW: 12.8 % (ref 11.5–15.5)
WBC: 4.8 10*3/uL (ref 4.0–10.5)
nRBC: 0 % (ref 0.0–0.2)

## 2022-11-09 LAB — BASIC METABOLIC PANEL
Anion gap: 7 (ref 5–15)
BUN: 13 mg/dL (ref 6–20)
CO2: 24 mmol/L (ref 22–32)
Calcium: 8.9 mg/dL (ref 8.9–10.3)
Chloride: 107 mmol/L (ref 98–111)
Creatinine, Ser: 0.82 mg/dL (ref 0.44–1.00)
GFR, Estimated: >60 mL/min (ref >60)
Glucose, Bld: 105 mg/dL — ABNORMAL HIGH (ref 70–99)
Potassium: 4 mmol/L (ref 3.5–5.1)
Sodium: 138 mmol/L (ref 135–145)

## 2022-11-09 LAB — TROPONIN I (HIGH SENSITIVITY): Troponin I (High Sensitivity): <2 ng/L (ref <18)

## 2022-11-09 LAB — TSH: TSH: 3.93 u[IU]/mL (ref 0.350–4.500)

## 2022-11-09 NOTE — Telephone Encounter (Signed)
Patient has been seen in ER for tachypalpitations that wake her from sleep   Nothing found on tele.  ER called to recomm follow up   REcomm Please set pt up with 3 week live Zio patch  She has appt as new pt on 9.9.24  If any openings prior then place at that time

## 2022-11-09 NOTE — Progress Notes (Unsigned)
Enrolled for [employer] to mail a ZIO AT Live Telemetry monitor to patients address on file.   One 14 day and one 7 day ZIO AT monitors requested for 21 days of monitoring.  12/02/22 My Chart Message Good morning - just for clarity, Dr. Tenny Craw assigned the Zio Patch post an ER visit on 8/27 however my cardiologist is Dr. Bjorn Pippin.   In a follow up visit with Dr. Bjorn Pippin, he reduced my batch to only wearing for 14 days NOT 21 days.  Therefore, I want to make sure [employer] does not wait for the 2nd patch to arrive to provide the data as I'm not wearing the 2nd patch per Dr. Bjorn Pippin instructions.    In addition, want to make sure Dr. Bjorn Pippin receives the data and not Dr. Tenny Craw.    Thank you!   [employer] contacted to cancel second monitor.  Dr. Bjorn Pippin to read.

## 2022-11-09 NOTE — Telephone Encounter (Signed)
Pt returning nurse's call. Please advise

## 2022-11-09 NOTE — Telephone Encounter (Signed)
Left message to call back  

## 2022-11-09 NOTE — ED Triage Notes (Addendum)
POV from home, A&O x 4, GCS 15, amb to room  C/o increased feelings of palpitations that began 3 weeks ago, worsened/more frequent yesterday, left shoulder pain and nausea, appt with cards on 9/9.

## 2022-11-09 NOTE — Discharge Instructions (Addendum)
The cardiology office should call you in the next day or 2 to arrange for a Zio patch.  Dr. Tenny Craw is aware of your situation and will see to it that these arrangements are made.  If you have not heard from them by tomorrow morning, call the number listed above to inquire about this.  Return to the ER if your symptoms significantly worsen or change.

## 2022-11-09 NOTE — Telephone Encounter (Signed)
Spoke with patient and she will wear monitor.  Monitor ordered

## 2022-11-09 NOTE — Telephone Encounter (Addendum)
Duplicate encounter. Please see previous encounter.  

## 2022-11-09 NOTE — ED Notes (Signed)
IT aware of EKG issues and work order placed. EKG manually entered into the chart by MD delo in photo form.

## 2022-11-09 NOTE — Addendum Note (Signed)
Addended by: Judene Companion on: 11/09/2022 09:18 AM   Modules accepted: Orders

## 2022-11-09 NOTE — ED Provider Notes (Signed)
[facility] EMERGENCY DE[facility]MENT AT [facility] Provider Note   CSN: [id number] Arrival date & time: 11/09/22  0450     History  Chief Complaint  Patient presents with   Palpitations    Megan Cuevas is a 57 y.o. female.  Patient is a 57 year old female with history of irritable bowel and hypertension.  Patient presenting today for evaluation of palpitations.  She has been seen here on 1 prior occasion with similar complaints.  She has been experiencing these episodes intermittently for the past 3 weeks.  They happen primarily at night.  This evening, she woke up with her heart racing and some left arm numbness.  She states that her watch told her her heart rate was nearly 120.  After she woke up, the heart rate then went back down to 60.  She continues to feel unwell and presents for evaluation of this.  She denies to me she is having any chest pain or shortness of breath.  No exertional symptoms.  She denies prior cardiac history.  She does have an upcoming appointment with cardiology on September 9.  The history is provided by the patient.       Home Medications Prior to Admission medications   Medication Sig Start Date End Date Taking? Authorizing Provider  adapalene (DIFFERIN) 0.1 % cream Apply 0.1 % topically daily.    [provider]  Azelaic Acid 15 % gel Apply 1 Application topically daily. COMPOUNDED FACIAL MEDICATION FROM DERMATOLOGY    [provider]  estradiol (ESTRACE) 1 MG tablet Take 1 mg by mouth daily. 0.5 to 1 mg daily    [provider]  estradiol-norethindrone (ACTIVELLA) 1-0.5 MG tablet Take 1 tablet by mouth daily. 07/29/22   [provider]  linaclotide Karlene Einstein) 72 MCG capsule Take 1 capsule (72 mcg total) by mouth daily before breakfast. Patient not taking: Reported on 07/14/2022 04/27/22   Imogene Burn, MD  lisinopril (ZESTRIL) 20 MG tablet Take 20 mg by mouth daily.    [provider]  Multiple  Vitamin (MULTIVITAMIN) capsule Take 1 capsule by mouth daily.    [provider]      Allergies    Patient has no known allergies.    Review of Systems   Review of Systems  All other systems reviewed and are negative.   Physical Exam Updated Vital Signs BP (!) 156/95   Pulse 79   Temp 98.6 F (37 C) (Oral)   Resp 16   Ht 5\' 3"  (1.6 m)   Wt 68 kg   LMP 05/29/2013   SpO2 99%   BMI 26.56 kg/m  Physical Exam Vitals and nursing note reviewed.  Constitutional:      General: She is not in acute distress.    Appearance: She is well-developed. She is not diaphoretic.  HENT:     Head: Normocephalic and atraumatic.  Cardiovascular:     Rate and Rhythm: Normal rate and regular rhythm.     Heart sounds: No murmur heard.    No friction rub. No gallop.  Pulmonary:     Effort: Pulmonary effort is normal. No respiratory distress.     Breath sounds: Normal breath sounds. No wheezing.  Abdominal:     General: Bowel sounds are normal. There is no distension.     Palpations: Abdomen is soft.     Tenderness: There is no abdominal tenderness.  Musculoskeletal:        General: Normal range of motion.  Cervical back: Normal range of motion and neck supple.  Skin:    General: Skin is warm and dry.  Neurological:     General: No focal deficit present.     Mental Status: She is alert and oriented to person, place, and time.     ED Results / Procedures / Treatments   Labs (all labs ordered are listed, but only abnormal results are displayed) Labs Reviewed - No data to display  EKG  ED ECG REPORT   Date: 11/09/2022  Rate: 78  Rhythm: normal sinus rhythm  QRS Axis: normal  Intervals: normal  ST/T Wave abnormalities: normal  Conduction Disutrbances:none  Narrative Interpretation:   Old EKG Reviewed: unchanged  I have personally reviewed the EKG tracing and agree with the computerized printout as noted.     Radiology No results found.  Procedures Procedures     Medications Ordered in ED Medications - No data to display  ED Course/ Medical Decision Making/ A&P  Patient presenting here with palpitations as described in the HPI.  This is her second visit to the ER for the same.  She has been experiencing frequent episodes of palpitations that occur at night and wake her from sleep.  Her smart watch gives her readings of heart rates up to 130.  She arrives here with a sinus rhythm and normal physical examination.  She is now feeling fine.  Workup initiated including CBC, basic metabolic panel, troponin, and TSH.  All studies unremarkable.  Care was discussed with Dr. Tenny Craw from cardiology.  She will make arrangements for the patient to have a Zio patch in the next few days.  Patient to be discharged to home with cardiology follow-up.  Final Clinical Impression(s) / ED Diagnoses Final diagnoses:  None    Rx / DC Orders ED Discharge Orders     None         Geoffery Lyons, MD 11/09/22 [phone]

## 2022-11-12 DIAGNOSIS — R002 Palpitations: Secondary | ICD-10-CM

## 2022-11-21 NOTE — Progress Notes (Unsigned)
Cardiology Office Note:    Date:  11/22/2022   ID:  Megan Cuevas, DOB April 10, 1965, MRN [id number]  PCP:  Ozella Rocks, MD  Cardiologist:  None  Electrophysiologist:  None   Referring MD: Ozella Rocks, MD   Chief Complaint  Patient presents with   Coronary Artery Disease    History of Present Illness:    Megan Cuevas is a 57 y.o. female with a hx of hypertension, IBS who is referred by Dr. Konrad Dolores for evaluation of palpitations.  She was seen in the ED for palpitations 11/09/2022.  Workup unremarkable, Zio patch was ordered.  She states the palpitations started on 10/13/2022.  Reports woke up from sleep so that she could not breathe and heart was racing.  She checked her pulse and it was in the 120s.  She went to ED, workup was unremarkable.  States that since that time she has had palpitations where she feels like her heart is racing.  It occurs at night, has been happening every day.  Heart rate will be up to 120s and will last for about an hour then resolved.  She started carvedilol last week and reports symptoms have improved.  Also what she has been having chest pains over the last week.  Describes as tightness on left side of her chest that radiates down her left arm.  Can last for few hours.  She denies any shortness of breath.  She denies any recent lightheadedness or syncope.  No lower extremity edema.  No smoking history.  Family history includes father had CVA in 20s.  Past Medical History:  Diagnosis Date   Hypertension    on meds   IBS (irritable bowel syndrome)     Past Surgical History:  Procedure Laterality Date   CESAREAN SECTION     x 2   COLONOSCOPY  2014   DP-MAC-moviprep(exc)-normal   TONSILLECTOMY     TUBAL LIGATION      Current Medications: Current Meds  Medication Sig   adapalene (DIFFERIN) 0.1 % cream Apply 0.1 % topically daily.   Azelaic Acid 15 % gel Apply 1 Application topically daily. COMPOUNDED FACIAL MEDICATION FROM DERMATOLOGY    estradiol-norethindrone (ACTIVELLA) 1-0.5 MG tablet Take 1 tablet by mouth daily.   lisinopril (ZESTRIL) 20 MG tablet Take 20 mg by mouth daily.   Multiple Vitamin (MULTIVITAMIN) capsule Take 1 capsule by mouth daily.   [DISCONTINUED] carvedilol (COREG) 3.125 MG tablet Take 3.125 mg by mouth 2 (two) times daily with a meal.     Allergies:   Patient has no known allergies.   Social History   Socioeconomic History   Marital status: Married    Spouse name: Not on file   Number of children: 2   Years of education: Not on file   Highest education level: Not on file  Occupational History   Occupation: ENGINEER    Employer: [employer]   Occupation: Art gallery manager  Tobacco Use   Smoking status: Never   Smokeless tobacco: Never  Vaping Use   Vaping status: Never Used  Substance and Sexual Activity   Alcohol use: Yes    Alcohol/week: 1.0 standard drink of alcohol    Types: 1 Glasses of wine per week    Comment: 1 glass a week   Drug use: No   Sexual activity: Not on file  Other Topics Concern   Not on file  Social History Narrative   Not on file   Social Determinants of Health  Financial Resource Strain: Not on file  Food Insecurity: Not on file  Transportation Needs: Not on file  Physical Activity: Not on file  Stress: Not on file  Social Connections: Unknown (07/27/2021)   Received from [facility]   Social Network    Social Network: Not on file     Family History: The patient's family history includes Breast cancer in her paternal aunt; Diabetes in her maternal grandmother and mother. There is no history of Colon cancer, Stomach cancer, Esophageal cancer, Colon polyps, or Rectal cancer.  ROS:   Please see the history of present illness.     All other systems reviewed and are negative.  EKGs/Labs/Other Studies Reviewed:    The following studies were reviewed today:   EKG:   11/22/2022: Normal sinus rhythm, rate 81, no ST abnormalities  Recent  Labs: 11/09/2022: BUN 13; Creatinine, Ser 0.82; Hemoglobin 13.2; Platelets 307; Potassium 4.0; Sodium 138; TSH 3.930  Recent Lipid Panel No results found for: "CHOL", "TRIG", "HDL", "CHOLHDL", "VLDL", "LDLCALC", "LDLDIRECT"  Physical Exam:    VS:  BP (!) 140/90   Pulse 73   Ht 5\' 3"  (1.6 m)   Wt 147 lb (66.7 kg)   LMP 05/29/2013   SpO2 97%   BMI 26.04 kg/m     Wt Readings from Last 3 Encounters:  11/22/22 147 lb (66.7 kg)  11/09/22 149 lb 14.6 oz (68 kg)  10/13/22 150 lb (68 kg)     GEN:  Well nourished, well developed in no acute distress HEENT: Normal NECK: No JVD; No carotid bruits LYMPHATICS: No lymphadenopathy CARDIAC: RRR, no murmurs, rubs, gallops RESPIRATORY:  Clear to auscultation without rales, wheezing or rhonchi  ABDOMEN: Soft, non-tender, non-distended MUSCULOSKELETAL:  No edema; No deformity  SKIN: Warm and dry NEUROLOGIC:  Alert and oriented x 3 PSYCHIATRIC:  Normal affect   ASSESSMENT:    1. Palpitations   2. Chest pain of uncertain etiology   3. Essential hypertension   4. Hyperlipidemia, unspecified hyperlipidemia type    PLAN:    Palpitations: Description concerning for arrhythmia, she is currently wearing Zio patch x 14 days.  Will follow-up results.  She reports improvement since starting carvedilol, will increase dose to 6.25 mg twice daily  Chest pain: Atypical in description but does have CAD risk factors (age, hypertension, hyperlipidemia).  Recommend coronary CTA to evaluate for obstructive CAD.  Asked patient to hold her home carvedilol and will give Lopressor 100 mg prior to study.  Check echocardiogram to rule out structural heart disease  Hypertension: On lisinopril 20 mg daily and carvedilol 3.125 mg BID.  BP elevated, will increase carvedilol to 6.25 mg twice daily.  Asked patient to check BP daily for next 2 weeks and let us know results  Hyperlipidemia: LDL 142 on 11/16/2022.  Will follow-up results of coronary CTA to guide how  aggressive to be in lowering cholesterol  RTC in 3 to 4 months  Medication Adjustments/Labs and Tests Ordered: Current medicines are reviewed at length with the patient today.  Concerns regarding medicines are outlined above.  Orders Placed This Encounter  Procedures   EKG 12-Lead   No orders of the defined types were placed in this encounter.   There are no Patient Instructions on file for this visit.   Signed, Little Ishikawa, MD  11/22/2022 3:45 PM    [facility]

## 2022-11-22 ENCOUNTER — Encounter: Payer: Self-pay | Admitting: Cardiology

## 2022-11-22 ENCOUNTER — Ambulatory Visit: Payer: 59 | Attending: Cardiology | Admitting: Cardiology

## 2022-11-22 VITALS — BP 140/90 | HR 73 | Ht 63.0 in | Wt 147.0 lb

## 2022-11-22 DIAGNOSIS — R079 Chest pain, unspecified: Secondary | ICD-10-CM | POA: Diagnosis not present

## 2022-11-22 DIAGNOSIS — R002 Palpitations: Secondary | ICD-10-CM

## 2022-11-22 DIAGNOSIS — E785 Hyperlipidemia, unspecified: Secondary | ICD-10-CM | POA: Diagnosis not present

## 2022-11-22 DIAGNOSIS — I10 Essential (primary) hypertension: Secondary | ICD-10-CM

## 2022-11-22 MED ORDER — METOPROLOL TARTRATE 100 MG PO TABS: 100.0000 mg | ORAL_TABLET | Freq: Once | ORAL | 0 refills | Status: DC

## 2022-11-22 MED ORDER — CARVEDILOL 6.25 MG PO TABS: 6.2500 mg | ORAL_TABLET | Freq: Two times a day (BID) | ORAL | 3 refills | Status: DC

## 2022-11-22 NOTE — Patient Instructions (Signed)
Medication Instructions:  Increase Coreg to 6.25 mg ( Take 1 Tablet Twice Daily). Metoprolol Tartrate 100 mg ( Take 90 Minutes- 2 Hours Prior To Scan). *If you need a refill on your cardiac medications before your next appointment, please call your pharmacy*   Lab Work: No Labs If you have labs (blood work) drawn today and your tests are completely normal, you will receive your results only by: MyChart Message (if you have MyChart) OR A paper copy in the mail If you have any lab test that is abnormal or we need to change your treatment, we will call you to review the results.   Testing/Procedures: [address]. Your physician has requested that you have an echocardiogram. Echocardiography is a painless test that uses sound waves to create images of your heart. It provides your doctor with information about the size and shape of your heart and how well your heart's chambers and valves are working. This procedure takes approximately one hour. There are no restrictions for this procedure. Please do NOT wear cologne, perfume, aftershave, or lotions (deodorant is allowed). Please arrive 15 minutes prior to your appointment time.     Your cardiac CT will be scheduled at one of the below locations:   [facility] [address] [place], [place] [address] [phone]   If scheduled at [facility], please arrive at the [facility] and Children's Entrance (Entrance C2) of [facility] 30 minutes prior to test start time. You can use the FREE valet parking offered at entrance C (encouraged to control the heart rate for the test)  Proceed to the [facility] Radiology Department (first floor) to check-in and test prep.  All radiology patients and guests should use entrance C2 at [facility], accessed from [facility], even though the hospital's physical address listed is [address]    If scheduled at [facility] or [facility], please arrive 15 mins early for check-in and test prep.  There is spacious parking and easy access to the radiology department from the [facility] entrance. Please enter here and check-in with the desk attendant.   Please follow these instructions carefully (unless otherwise directed):  An IV will be required for this test and Nitroglycerin will be given.   Hold  : Coreg Day of Test   On the Night Before the Test: Be sure to Drink plenty of water. Do not consume any caffeinated/decaffeinated beverages or chocolate 12 hours prior to your test. Do not take any antihistamines 12 hours prior to your test.    On the Day of the Test: Drink plenty of water until 1 hour prior to the test. Do not eat any food 1 hour prior to test. You may take your regular medications prior to the test.  Take metoprolol (Lopressor) two hours prior to test. If you take Furosemide/Hydrochlorothiazide/Spironolactone, please HOLD on the morning of the test. FEMALES- please wear underwire-free bra if available, avoid dresses & tight clothing  After the Test: Drink plenty of water. After receiving IV contrast, you may experience a mild flushed feeling. This is normal. On occasion, you may experience a mild rash up to 24 hours after the test. This is not dangerous. If this occurs, you can take Benadryl 25 mg and increase your fluid intake. If you experience trouble breathing, this can be serious. If it is severe call 911 IMMEDIATELY. If it is mild, please  call our office. If you take any of these medications: Glipizide/Metformin, Avandament, Glucavance, please do not take 48 hours after completing test unless otherwise instructed.  We will call to schedule your test 2-4 weeks out understanding that some insurance companies will need an authorization prior to the service being performed.   For more information and frequently asked questions,  please visit our website : [email or web address]  For non-scheduling related questions, please contact the cardiac imaging nurse navigator should you have any questions/concerns: Cardiac Imaging Nurse Navigators Direct Office Dial: [phone]   For scheduling needs, including cancellations and rescheduling, please call [place], [phone].   Follow-Up: At [facility], you and your health needs are our priority.  As part of our continuing mission to provide you with exceptional heart care, we have created designated Provider Care Teams.  These Care Teams include your primary Cardiologist (physician) and Advanced Practice Providers (APPs -  Physician Assistants and Nurse Practitioners) who all work together to provide you with the care you need, when you need it.  Your next appointment:   3 month(s)  Provider:   Epifanio Lesches, MD   Other Instructions Log Blood Pressure Daily For 1 Week.

## 2022-11-26 ENCOUNTER — Encounter (HOSPITAL_COMMUNITY): Payer: Self-pay

## 2022-11-29 ENCOUNTER — Telehealth (HOSPITAL_COMMUNITY): Payer: Self-pay | Admitting: *Deleted

## 2022-11-29 NOTE — Telephone Encounter (Signed)
Reaching out to patient to offer assistance regarding upcoming cardiac imaging study; pt verbalizes understanding of appt date/time, parking situation and where to check in, pre-test NPO status and medications ordered, and verified current allergies; name and call back number provided for further questions should they arise  Larey Brick RN Navigator Cardiac Imaging [facility] [phone] office [phone] cell  Patient to take 100mg  metoprolol tartrate two hours prior to her cardiac CT scan. She is aware to arrive at [facility].

## 2022-11-30 ENCOUNTER — Ambulatory Visit (HOSPITAL_COMMUNITY)
Admission: RE | Admit: 2022-11-30 | Discharge: 2022-11-30 | Disposition: A | Discharge status: Home / Self Care | Payer: 59 | Source: Ambulatory Visit | Attending: Cardiology | Admitting: Cardiology

## 2022-11-30 DIAGNOSIS — R002 Palpitations: Secondary | ICD-10-CM | POA: Diagnosis present

## 2022-11-30 DIAGNOSIS — I10 Essential (primary) hypertension: Secondary | ICD-10-CM | POA: Insufficient documentation

## 2022-11-30 DIAGNOSIS — R079 Chest pain, unspecified: Secondary | ICD-10-CM | POA: Insufficient documentation

## 2022-11-30 DIAGNOSIS — E785 Hyperlipidemia, unspecified: Secondary | ICD-10-CM | POA: Diagnosis present

## 2022-11-30 DIAGNOSIS — R072 Precordial pain: Secondary | ICD-10-CM | POA: Diagnosis not present

## 2022-11-30 MED ORDER — NITROGLYCERIN 0.4 MG SL SUBL
0.8000 mg | SUBLINGUAL_TABLET | Freq: Once | SUBLINGUAL | Status: AC
  Administered 2022-11-30: 0.8 mg via SUBLINGUAL

## 2022-11-30 MED ORDER — IOHEXOL 350 MG/ML SOLN
100.0000 mL | Freq: Once | INTRAVENOUS | Status: AC | PRN
  Administered 2022-11-30: 100 mL via INTRAVENOUS

## 2022-11-30 MED ORDER — NITROGLYCERIN 0.4 MG SL SUBL
SUBLINGUAL_TABLET | SUBLINGUAL | Status: AC
  Filled 2022-11-30: qty 2

## 2022-12-07 ENCOUNTER — Encounter: Payer: Self-pay | Admitting: Cardiology

## 2022-12-08 ENCOUNTER — Other Ambulatory Visit: Payer: Self-pay

## 2022-12-08 MED ORDER — CARVEDILOL 12.5 MG PO TABS: 12.5000 mg | ORAL_TABLET | Freq: Two times a day (BID) | ORAL | 3 refills | Status: DC

## 2022-12-08 NOTE — Telephone Encounter (Signed)
BP remains elevated, recommend increasing carvedilol to 12.5 mg twice daily.  Recommend checking BP daily for next 2 weeks and let us know results

## 2022-12-08 NOTE — Telephone Encounter (Signed)
Spoke to patient Dr.Schumann advised to increase Carvedilol to 12.5 mg twice a day.Continue to check B/P daily for 2 weeks and send readings to mychart.

## 2022-12-13 ENCOUNTER — Ambulatory Visit (HOSPITAL_COMMUNITY): Payer: 59 | Attending: Cardiology

## 2022-12-13 DIAGNOSIS — E785 Hyperlipidemia, unspecified: Secondary | ICD-10-CM

## 2022-12-13 DIAGNOSIS — I10 Essential (primary) hypertension: Secondary | ICD-10-CM

## 2022-12-13 DIAGNOSIS — R002 Palpitations: Secondary | ICD-10-CM

## 2022-12-13 DIAGNOSIS — R079 Chest pain, unspecified: Secondary | ICD-10-CM

## 2022-12-13 LAB — ECHOCARDIOGRAM COMPLETE
Area-P 1/2: 5.88 cm2
S' Lateral: 2.3 cm

## 2022-12-17 ENCOUNTER — Ambulatory Visit: Payer: 59 | Attending: Cardiology | Admitting: Cardiology

## 2022-12-17 ENCOUNTER — Encounter: Payer: Self-pay | Admitting: Cardiology

## 2022-12-17 VITALS — BP 130/80 | HR 72 | Ht 63.0 in | Wt 146.0 lb

## 2022-12-17 DIAGNOSIS — R002 Palpitations: Secondary | ICD-10-CM | POA: Diagnosis not present

## 2022-12-17 DIAGNOSIS — E785 Hyperlipidemia, unspecified: Secondary | ICD-10-CM

## 2022-12-17 DIAGNOSIS — I10 Essential (primary) hypertension: Secondary | ICD-10-CM | POA: Diagnosis not present

## 2022-12-17 DIAGNOSIS — R079 Chest pain, unspecified: Secondary | ICD-10-CM

## 2022-12-17 NOTE — Progress Notes (Signed)
Cardiology Office Note:    Date:  12/17/2022   ID:  Megan Cuevas, DOB [birth year], MRN [id number]  PCP:  Ozella Rocks, MD  Cardiologist:  None  Electrophysiologist:  None   Referring MD: Ozella Rocks, MD   Chief Complaint  Patient presents with   Hypertension    History of Present Illness:    Megan Cuevas is a 57 y.o. female with a hx of hypertension, IBS who presents for follow-up.  She was referred by Dr. Konrad Dolores for evaluation of palpitations, initially seen 11/22/2022.  She was seen in the ED for palpitations 11/09/2022.  Workup unremarkable, Zio patch was ordered.  She states the palpitations started on 10/13/2022.  Reports woke up from sleep and that she could not breathe and heart was racing.  She checked her pulse and it was in the 120s.  She went to ED, workup was unremarkable.  States that since that time she has had palpitations where she feels like her heart is racing.  It occurs at night, has been happening every day.  Heart rate will be up to 120s and will last for about an hour then resolved.  She started carvedilol last week and reports symptoms have improved.  Also she has been having chest pains over the last week.  Describes as tightness on left side of her chest that radiates down her left arm.  Can last for few hours.  She denies any shortness of breath.  She denies any recent lightheadedness or syncope.  No lower extremity edema.  No smoking history.  Family history includes father had CVA in 73s.  Coronary CTA on 11/30/2022 showed normal coronary arteries, calcium score 0.  Echocardiogram 12/13/2022 showed normal biventricular function, no significant valvular disease.  Zio patch x 14 days 10/2022 showed 1 episode of NSVT lasting 4 beats.  Since last clinic visit, she reports she is doing much better.  Denies any chest pain.  Reports palpitations have resolved.  Brought home BP log, since increasing carvedilol to 12.5 mg twice daily BP has been mostly 120s to  130s.  She denies any dyspnea, lightheadedness, syncope, or palpitations.   Past Medical History:  Diagnosis Date   Hypertension    on meds   IBS (irritable bowel syndrome)     Past Surgical History:  Procedure Laterality Date   CESAREAN SECTION     x 2   COLONOSCOPY  2014   DP-MAC-moviprep(exc)-normal   TONSILLECTOMY     TUBAL LIGATION      Current Medications: Current Meds  Medication Sig   adapalene (DIFFERIN) 0.1 % cream Apply 0.1 % topically daily.   Azelaic Acid 15 % gel Apply 1 Application topically daily. COMPOUNDED FACIAL MEDICATION FROM DERMATOLOGY   carvedilol (COREG) 12.5 MG tablet Take 1 tablet (12.5 mg total) by mouth 2 (two) times daily.   estradiol (ESTRACE) 1 MG tablet Take 1 mg by mouth daily. 0.5 to 1 mg daily   estradiol-norethindrone (ACTIVELLA) 1-0.5 MG tablet Take 1 tablet by mouth daily.   linaclotide (LINZESS) 72 MCG capsule Take 1 capsule (72 mcg total) by mouth daily before breakfast.   lisinopril (ZESTRIL) 20 MG tablet Take 20 mg by mouth daily.   Multiple Vitamin (MULTIVITAMIN) capsule Take 1 capsule by mouth daily.     Allergies:   Patient has no known allergies.   Social History   Socioeconomic History   Marital status: Married    Spouse name: Not on file   Number of  children: 2   Years of education: Not on file   Highest education level: Not on file  Occupational History   Occupation: ENGINEER    Employer: [employer]   Occupation: Art gallery manager  Tobacco Use   Smoking status: Never   Smokeless tobacco: Never  Vaping Use   Vaping status: Never Used  Substance and Sexual Activity   Alcohol use: Yes    Alcohol/week: 1.0 standard drink of alcohol    Types: 1 Glasses of wine per week    Comment: 1 glass a week   Drug use: No   Sexual activity: Not on file  Other Topics Concern   Not on file  Social History Narrative   Not on file   Social Determinants of Health   Financial Resource Strain: Not on file  Food Insecurity: Not on file   Transportation Needs: Not on file  Physical Activity: Not on file  Stress: Not on file  Social Connections: Unknown (07/27/2021)   Received from [facility]   Social Network    Social Network: Not on file     Family History: The patient's family history includes Breast cancer in her paternal aunt; Diabetes in her maternal grandmother and mother. There is no history of Colon cancer, Stomach cancer, Esophageal cancer, Colon polyps, or Rectal cancer.  ROS:   Please see the history of present illness.     All other systems reviewed and are negative.  EKGs/Labs/Other Studies Reviewed:    The following studies were reviewed today:   EKG:   11/22/2022: Normal sinus rhythm, rate 81, no ST abnormalities  Recent Labs: 11/09/2022: BUN 13; Creatinine, Ser 0.82; Hemoglobin 13.2; Platelets 307; Potassium 4.0; Sodium 138; TSH 3.930  Recent Lipid Panel No results found for: "CHOL", "TRIG", "HDL", "CHOLHDL", "VLDL", "LDLCALC", "LDLDIRECT"  Physical Exam:    VS:  BP 130/80   Pulse 72   Ht 5\' 3"  (1.6 m)   Wt 146 lb (66.2 kg)   LMP 05/29/2013   SpO2 98%   BMI 25.86 kg/m     Wt Readings from Last 3 Encounters:  12/17/22 146 lb (66.2 kg)  11/22/22 147 lb (66.7 kg)  11/09/22 149 lb 14.6 oz (68 kg)     GEN:  Well nourished, well developed in no acute distress HEENT: Normal NECK: No JVD; No carotid bruits LYMPHATICS: No lymphadenopathy CARDIAC: RRR, no murmurs, rubs, gallops RESPIRATORY:  Clear to auscultation without rales, wheezing or rhonchi  ABDOMEN: Soft, non-tender, non-distended MUSCULOSKELETAL:  No edema; No deformity  SKIN: Warm and dry NEUROLOGIC:  Alert and oriented x 3 PSYCHIATRIC:  Normal affect   ASSESSMENT:    1. Chest pain of uncertain etiology   2. Palpitations   3. Essential hypertension   4. Hyperlipidemia, unspecified hyperlipidemia type     PLAN:    Palpitations: Zio patch x 14 days 10/2022 showed 1 episode of NSVT lasting 4 beats. She  reports improvement since starting carvedilol, will continue  Chest pain: Coronary CTA on 11/30/2022 showed normal coronary arteries, calcium score 0.  Echocardiogram 12/13/2022 showed normal biventricular function, no significant valvular disease.    Hypertension: On lisinopril 20 mg daily and carvedilol 12.5 mg BID.  Appears controlled  Hyperlipidemia: LDL 142 on 11/16/2022.  Normal coronary arteries on coronary CTA 11/2022 as above.  Diet/exercise recommended  RTC in 6 months  Medication Adjustments/Labs and Tests Ordered: Current medicines are reviewed at length with the patient today.  Concerns regarding medicines are outlined above.  No orders  of the defined types were placed in this encounter.  No orders of the defined types were placed in this encounter.   Patient Instructions  Medication Instructions:  No Changeas *If you need a refill on your cardiac medications before your next appointment, please call your pharmacy*   Lab Work: No Labs If you have labs (blood work) drawn today and your tests are completely normal, you will receive your results only by: MyChart Message (if you have MyChart) OR A paper copy in the mail If you have any lab test that is abnormal or we need to change your treatment, we will call you to review the results.   Testing/Procedures: No Testing   Follow-Up: At [facility], you and your health needs are our priority.  As part of our continuing mission to provide you with exceptional heart care, we have created designated Provider Care Teams.  These Care Teams include your primary Cardiologist (physician) and Advanced Practice Providers (APPs -  Physician Assistants and Nurse Practitioners) who all work together to provide you with the care you need, when you need it.  We recommend signing up for the patient portal called "MyChart".  Sign up information is provided on this After Visit Summary.  MyChart is used to connect with patients for  Virtual Visits (Telemedicine).  Patients are able to view lab/test results, encounter notes, upcoming appointments, etc.  Non-urgent messages can be sent to your provider as well.   To learn more about what you can do with MyChart, go to [email or web address].    Your next appointment:   6 month(s)  Provider:   Epifanio Lesches, MD     Signed, Little Ishikawa, MD  12/17/2022 12:54 PM    [facility]

## 2022-12-17 NOTE — Patient Instructions (Signed)
Medication Instructions:  No Changeas *If you need a refill on your cardiac medications before your next appointment, please call your pharmacy*   Lab Work: No Labs If you have labs (blood work) drawn today and your tests are completely normal, you will receive your results only by: MyChart Message (if you have MyChart) OR A paper copy in the mail If you have any lab test that is abnormal or we need to change your treatment, we will call you to review the results.   Testing/Procedures: No Testing   Follow-Up: At [facility], you and your health needs are our priority.  As part of our continuing mission to provide you with exceptional heart care, we have created designated Provider Care Teams.  These Care Teams include your primary Cardiologist (physician) and Advanced Practice Providers (APPs -  Physician Assistants and Nurse Practitioners) who all work together to provide you with the care you need, when you need it.  We recommend signing up for the patient portal called "MyChart".  Sign up information is provided on this After Visit Summary.  MyChart is used to connect with patients for Virtual Visits (Telemedicine).  Patients are able to view lab/test results, encounter notes, upcoming appointments, etc.  Non-urgent messages can be sent to your provider as well.   To learn more about what you can do with MyChart, go to [email or web address].    Your next appointment:   6 month(s)  Provider:   Epifanio Lesches, MD

## 2023-03-21 ENCOUNTER — Ambulatory Visit: Payer: 59 | Admitting: Cardiology

## 2023-03-21 ENCOUNTER — Ambulatory Visit: Payer: 59 | Attending: Cardiology | Admitting: Cardiology

## 2023-03-21 ENCOUNTER — Encounter: Payer: Self-pay | Admitting: Cardiology

## 2023-03-21 VITALS — BP 130/90 | HR 65 | Ht 63.0 in | Wt 146.2 lb

## 2023-03-21 DIAGNOSIS — E785 Hyperlipidemia, unspecified: Secondary | ICD-10-CM | POA: Diagnosis not present

## 2023-03-21 DIAGNOSIS — I10 Essential (primary) hypertension: Secondary | ICD-10-CM

## 2023-03-21 DIAGNOSIS — R002 Palpitations: Secondary | ICD-10-CM | POA: Diagnosis not present

## 2023-03-21 DIAGNOSIS — R079 Chest pain, unspecified: Secondary | ICD-10-CM

## 2023-03-21 NOTE — Patient Instructions (Signed)
 Medication Instructions:  Continue current medications *If you need a refill on your cardiac medications before your next appointment, please call your pharmacy*   Lab Work: BMET, mg today If you have labs (blood work) drawn today and your tests are completely normal, you will receive your results only by: MyChart Message (if you have MyChart) OR A paper copy in the mail If you have any lab test that is abnormal or we need to change your treatment, we will call you to review the results.   Testing/Procedures: none   Follow-Up: At [facility], you and your health needs are our priority.  As part of our continuing mission to provide you with exceptional heart care, we have created designated Provider Care Teams.  These Care Teams include your primary Cardiologist (physician) and Advanced Practice Providers (APPs -  Physician Assistants and Nurse Practitioners) who all work together to provide you with the care you need, when you need it.  We recommend signing up for the patient portal called MyChart.  Sign up information is provided on this After Visit Summary.  MyChart is used to connect with patients for Virtual Visits (Telemedicine).  Patients are able to view lab/test results, encounter notes, upcoming appointments, etc.  Non-urgent messages can be sent to your provider as well.   To learn more about what you can do with MyChart, go to [email or web address].    Your next appointment:   6 month(s)  Provider:   Dr. Kate  Other Instructions Referral to Pharm D Please check your blood pressure and bring readings to your visit Please bring your blood pressure monitor to this visit to be calibrated

## 2023-03-21 NOTE — Progress Notes (Signed)
 Cardiology Office Note:    Date:  03/21/2023   ID:  LAKIAH DHINGRA, DOB 20-Jan-1966, MRN [id number]  PCP:  Remonia Alm PARAS, MD  Cardiologist:  None  Electrophysiologist:  None   Referring MD: Remonia Alm PARAS, MD   Chief Complaint  Patient presents with   Hypertension    History of Present Illness:    Megan Cuevas is a 58 y.o. female with a hx of hypertension, IBS who presents for follow-up.  She was referred by Dr. Merrell for evaluation of palpitations, initially seen 11/22/2022.  She was seen in the ED for palpitations 11/09/2022.  Workup unremarkable, Zio patch was ordered.  She states the palpitations started on 10/13/2022.  Reports woke up from sleep and that she could not breathe and heart was racing.  She checked her pulse and it was in the 120s.  She went to ED, workup was unremarkable.  States that since that time she has had palpitations where she feels like her heart is racing.  It occurs at night, has been happening every day.  Heart rate will be up to 120s and will last for about an hour then resolved.  She started carvedilol  last week and reports symptoms have improved.  Also she has been having chest pains over the last week.  Describes as tightness on left side of her chest that radiates down her left arm.  Can last for few hours.  She denies any shortness of breath.  She denies any recent lightheadedness or syncope.  No lower extremity edema.  No smoking history.  Family history includes father had CVA in 69s.  Coronary CTA on 11/30/2022 showed normal coronary arteries, calcium score 0.  Echocardiogram 12/13/2022 showed normal biventricular function, no significant valvular disease.  Zio patch x 14 days 10/2022 showed 1 episode of NSVT lasting 4 beats.  Since last clinic visit, she reports he is doing well.  Reports palpitations have improved.  Continues to have intermittent chest pain.  Reports BP has been 150s over 100s when checks at home.   Past Medical History:   Diagnosis Date   Hypertension    on meds   IBS (irritable bowel syndrome)     Past Surgical History:  Procedure Laterality Date   CESAREAN SECTION     x 2   COLONOSCOPY  2014   DP-MAC-moviprep (exc)-normal   TONSILLECTOMY     TUBAL LIGATION      Current Medications: Current Meds  Medication Sig   adapalene (DIFFERIN) 0.1 % cream Apply 0.1 % topically daily.   Azelaic Acid 15 % gel Apply 1 Application topically daily. COMPOUNDED FACIAL MEDICATION FROM DERMATOLOGY   estradiol-norethindrone (ACTIVELLA) 1-0.5 MG tablet Take 1 tablet by mouth daily.   hydrochlorothiazide (HYDRODIURIL) 25 MG tablet Take 25 mg by mouth daily.   losartan (COZAAR) 50 MG tablet Take 50 mg by mouth daily.   Multiple Vitamin (MULTIVITAMIN) capsule Take 1 capsule by mouth daily.     Allergies:   Patient has no known allergies.   Social History   Socioeconomic History   Marital status: Married    Spouse name: Not on file   Number of children: 2   Years of education: Not on file   Highest education level: Not on file  Occupational History   Occupation: ENGINEER    Employer: [employer]   Occupation: art gallery manager  Tobacco Use   Smoking status: Never   Smokeless tobacco: Never  Vaping Use   Vaping status: Never Used  Substance  and Sexual Activity   Alcohol use: Yes    Alcohol/week: 1.0 standard drink of alcohol    Types: 1 Glasses of wine per week    Comment: 1 glass a week   Drug use: No   Sexual activity: Not on file  Other Topics Concern   Not on file  Social History Narrative   Not on file   Social Drivers of Health   Financial Resource Strain: Not on file  Food Insecurity: Not on file  Transportation Needs: Not on file  Physical Activity: Not on file  Stress: Not on file  Social Connections: Unknown (07/27/2021)   Received from [facility], [facility]   Social Network    Social Network: Not on file     Family History: The patient's family history includes Breast cancer in her  paternal aunt; Diabetes in her maternal grandmother and mother. There is no history of Colon cancer, Stomach cancer, Esophageal cancer, Colon polyps, or Rectal cancer.  ROS:   Please see the history of present illness.     All other systems reviewed and are negative.  EKGs/Labs/Other Studies Reviewed:    The following studies were reviewed today:   EKG:   11/22/2022: Normal sinus rhythm, rate 81, no ST abnormalities  Recent Labs: 11/09/2022: BUN 13; Creatinine, Ser 0.82; Hemoglobin 13.2; Platelets 307; Potassium 4.0; Sodium 138; TSH 3.930  Recent Lipid Panel No results found for: CHOL, TRIG, HDL, CHOLHDL, VLDL, LDLCALC, LDLDIRECT  Physical Exam:    VS:  BP (!) 130/90 (BP Location: Left Arm, Patient Position: Sitting, Cuff Size: Normal)   Pulse 65   Ht 5' 3 (1.6 m)   Wt 146 lb 3.2 oz (66.3 kg)   LMP 05/29/2013   SpO2 99%   BMI 25.90 kg/m     Wt Readings from Last 3 Encounters:  03/21/23 146 lb 3.2 oz (66.3 kg)  12/17/22 146 lb (66.2 kg)  11/22/22 147 lb (66.7 kg)     GEN:  Well nourished, well developed in no acute distress HEENT: Normal NECK: No JVD; No carotid bruits LYMPHATICS: No lymphadenopathy CARDIAC: RRR, no murmurs, rubs, gallops RESPIRATORY:  Clear to auscultation without rales, wheezing or rhonchi  ABDOMEN: Soft, non-tender, non-distended MUSCULOSKELETAL:  No edema; No deformity  SKIN: Warm and dry NEUROLOGIC:  Alert and oriented x 3 PSYCHIATRIC:  Normal affect   ASSESSMENT:    1. Essential hypertension   2. Hyperlipidemia, unspecified hyperlipidemia type   3. Chest pain of uncertain etiology   4. Palpitations      PLAN:    Palpitations: Zio patch x 14 days 10/2022 showed 1 episode of NSVT lasting 4 beats. She reports improvement since starting carvedilol , will continue  Chest pain: Coronary CTA on 11/30/2022 showed normal coronary arteries, calcium score 0.  Echocardiogram 12/13/2022 showed normal biventricular function, no significant  valvular disease.    Hypertension: Had been on lisinopril 20 mg daily and carvedilol  12.5 mg BID.  However reports BP has recently been uncontrolled and medications were changed to losartan 100 mg daily and added HCTZ 25 mg daily yesterday.  BP remains mildly elevated in clinic today.  Will ask patient to check BP twice daily for next 2 weeks and bring log and home BP monitor to calibrate appointment in pharmacy hypertension clinic.  Check BMET  Hyperlipidemia: LDL 142 on 11/16/2022.  Normal coronary arteries on coronary CTA 11/2022 as above.  Diet/exercise recommended  RTC in 6 months  Medication Adjustments/Labs and Tests Ordered: Current medicines are  reviewed at length with the patient today.  Concerns regarding medicines are outlined above.  Orders Placed This Encounter  Procedures   Basic Metabolic Panel (BMET)   Magnesium   AMB Referral to [facility] Pharm[facility]   No orders of the defined types were placed in this encounter.   Patient Instructions  Medication Instructions:  Continue current medications *If you need a refill on your cardiac medications before your next appointment, please call your pharmacy*   Lab Work: BMET, mg today If you have labs (blood work) drawn today and your tests are completely normal, you will receive your results only by: MyChart Message (if you have MyChart) OR A paper copy in the mail If you have any lab test that is abnormal or we need to change your treatment, we will call you to review the results.   Testing/Procedures: none   Follow-Up: At [facility], you and your health needs are our priority.  As part of our continuing mission to provide you with exceptional heart care, we have created designated Provider Care Teams.  These Care Teams include your primary Cardiologist (physician) and Advanced Practice Providers (APPs -  Physician Assistants and Nurse Practitioners) who all work together to provide you with the care you need, when  you need it.  We recommend signing up for the patient portal called MyChart.  Sign up information is provided on this After Visit Summary.  MyChart is used to connect with patients for Virtual Visits (Telemedicine).  Patients are able to view lab/test results, encounter notes, upcoming appointments, etc.  Non-urgent messages can be sent to your provider as well.   To learn more about what you can do with MyChart, go to [email or web address].    Your next appointment:   6 month(s)  Provider:   Dr. Kate  Other Instructions Referral to Pharm D Please check your blood pressure and bring readings to your visit Please bring your blood pressure monitor to this visit to be calibrated        Signed, Lonni LITTIE Kate, MD  03/21/2023 5:51 PM    [facility]

## 2023-03-22 LAB — BASIC METABOLIC PANEL
BUN/Creatinine Ratio: 17 (ref 9–23)
BUN: 13 mg/dL (ref 6–24)
CO2: 22 mmol/L (ref 20–29)
Calcium: 9.6 mg/dL (ref 8.7–10.2)
Chloride: 99 mmol/L (ref 96–106)
Creatinine, Ser: 0.77 mg/dL (ref 0.57–1.00)
Glucose: 81 mg/dL (ref 70–99)
Potassium: 4.2 mmol/L (ref 3.5–5.2)
Sodium: 140 mmol/L (ref 134–144)
eGFR: 91 mL/min/{1.73_m2} (ref >59)

## 2023-03-22 LAB — MAGNESIUM: Magnesium: 2.1 mg/dL (ref 1.6–2.3)

## 2023-03-25 ENCOUNTER — Ambulatory Visit: Payer: 59

## 2023-04-06 ENCOUNTER — Encounter: Payer: Self-pay | Admitting: *Deleted

## 2023-04-06 ENCOUNTER — Ambulatory Visit: Payer: 59

## 2023-05-24 ENCOUNTER — Ambulatory Visit: Payer: 59 | Attending: Cardiovascular Disease | Admitting: Pharmacist

## 2023-05-24 ENCOUNTER — Encounter: Payer: Self-pay | Admitting: Pharmacist

## 2023-05-24 VITALS — BP 117/75 | HR 78

## 2023-05-24 DIAGNOSIS — I10 Essential (primary) hypertension: Secondary | ICD-10-CM | POA: Diagnosis not present

## 2023-05-24 DIAGNOSIS — N750 Cyst of Bartholin's gland: Secondary | ICD-10-CM | POA: Insufficient documentation

## 2023-05-24 DIAGNOSIS — M5030 Other cervical disc degeneration, unspecified cervical region: Secondary | ICD-10-CM | POA: Insufficient documentation

## 2023-05-24 NOTE — Patient Instructions (Addendum)
 It was nice meeting you today  We would like your blood pressure to be less than 130/80  Please continue: Amlodipine 5mg  daily Carvedilol 12.5mg  BID Losartan 100mg  daily  Continue your exercise regimen.  Try to pay attention to how much salt you are eating. I will see you back in 2 days to recheck your blood pressure  Laural Golden, PharmD, BCACP, CDCES, CPP [address], [address] [place], [place], [address] Phone: [phone], Fax: [phone]

## 2023-05-24 NOTE — Progress Notes (Signed)
 Patient ID: Megan Cuevas                 DOB: October 08, 1965                      MRN: [id number]     HPI: Megan Cuevas is a 58 y.o. female referred by Dr. Bjorn Pippin to HTN clinic. PMH is significant for HTN and HLD.   Patient previously on lisinopril but this was discontinued and was started on hydrochlorothiazide. Hydrochlorothiazide caused her to itch all over her body which stopped when she discontinued the medication. Then switched to amlodipine and losartan. Blood pressure at last appointment with Dr Bjorn Pippin was improved but still elevated.  Recent readings from home: 3/11: 116/85 3/4: 129/88 3/1: 137/85 2/12: 116/84  Brought her home BP cuff to compare with clinic however the batteries died today.  Drinks 1 cup of coffee daily and does not add extra salt to food. Exercises at the gym 3 days a week. Reports her stress level has risen recently due to having a new boss.   Current HTN meds:  Losartan 100mg  daily at night Carvedilol 12.5mg  twice a day Amlodipine 5mg  daily at night  Previously tried:  Lisinopril Hydrochlorothiazide  BP goal: <130/80  Wt Readings from Last 3 Encounters:  03/21/23 146 lb 3.2 oz (66.3 kg)  12/17/22 146 lb (66.2 kg)  11/22/22 147 lb (66.7 kg)   BP Readings from Last 3 Encounters:  03/21/23 (!) 130/90  12/17/22 130/80  11/30/22 134/80   Pulse Readings from Last 3 Encounters:  03/21/23 65  12/17/22 72  11/22/22 73    Renal function: CrCl cannot be calculated (Patient's most recent lab result is older than the maximum 21 days allowed.).  Past Medical History:  Diagnosis Date   Hypertension    on meds   IBS (irritable bowel syndrome)     Current Outpatient Medications on File Prior to Visit  Medication Sig Dispense Refill   amLODipine (NORVASC) 5 MG tablet Take 5 mg by mouth daily.     adapalene (DIFFERIN) 0.1 % cream Apply 0.1 % topically daily.     Azelaic Acid 15 % gel Apply 1 Application topically daily. COMPOUNDED FACIAL  MEDICATION FROM DERMATOLOGY     carvedilol (COREG) 12.5 MG tablet Take 1 tablet (12.5 mg total) by mouth 2 (two) times daily. 180 tablet 3   estradiol-norethindrone (ACTIVELLA) 1-0.5 MG tablet Take 1 tablet by mouth daily.     hydrochlorothiazide (HYDRODIURIL) 25 MG tablet Take 25 mg by mouth daily.     losartan (COZAAR) 50 MG tablet Take 50 mg by mouth daily.     Multiple Vitamin (MULTIVITAMIN) capsule Take 1 capsule by mouth daily.     No current facility-administered medications on file prior to visit.    No Known Allergies   Assessment/Plan:  1. Hypertension -  Patient BP in room 117/75 which is at goal of <130/80. Unfortunately home cuff not working today in room. Home readings she brought in are slightly higher than office reading. Advised patient to bring in cuff again on 3/13 for comparison. No medication changes needed today.  Continue: Amlodipine 5mg  daily Carvedilol 12.5mg  BID Losartan 100mg  daily Recheck as needed Hydrochlorothiazide added to allergy list  Laural Golden, PharmD, BCACP, CDCES, CPP [address], [address] [place], [place], [address] Phone: [phone], Fax: [phone]

## 2023-05-26 ENCOUNTER — Encounter: Payer: Self-pay | Admitting: Pharmacist

## 2023-05-26 ENCOUNTER — Telehealth: Payer: Self-pay | Admitting: Pharmacist

## 2023-05-26 DIAGNOSIS — I10 Essential (primary) hypertension: Secondary | ICD-10-CM

## 2023-05-26 NOTE — Telephone Encounter (Signed)
 Brought home cuff: 138/90, in office cuff 137/85. Home cuff appears accurate. Will continue current medications and recheck in 1 month

## 2023-05-26 NOTE — Addendum Note (Signed)
 Addended by: Cheree Ditto on: 05/26/2023 04:11 PM   Modules accepted: Orders, Level of Service

## 2023-05-30 ENCOUNTER — Ambulatory Visit

## 2023-07-21 ENCOUNTER — Ambulatory Visit: Admitting: Pharmacist

## 2023-07-21 NOTE — Progress Notes (Deleted)
 Patient ID: Megan Cuevas                 DOB: October 08, 1965                      MRN: [id number]     HPI: Megan Cuevas is a 58 y.o. female referred by Dr. Bjorn Pippin to HTN clinic. PMH is significant for HTN and HLD.   Patient previously on lisinopril but this was discontinued and was started on hydrochlorothiazide. Hydrochlorothiazide caused her to itch all over her body which stopped when she discontinued the medication. Then switched to amlodipine and losartan. Blood pressure at last appointment with Dr Bjorn Pippin was improved but still elevated.  Recent readings from home: 3/11: 116/85 3/4: 129/88 3/1: 137/85 2/12: 116/84  Brought her home BP cuff to compare with clinic however the batteries died today.  Drinks 1 cup of coffee daily and does not add extra salt to food. Exercises at the gym 3 days a week. Reports her stress level has risen recently due to having a new boss.   Current HTN meds:  Losartan 100mg  daily at night Carvedilol 12.5mg  twice a day Amlodipine 5mg  daily at night  Previously tried:  Lisinopril Hydrochlorothiazide  BP goal: <130/80  Wt Readings from Last 3 Encounters:  03/21/23 146 lb 3.2 oz (66.3 kg)  12/17/22 146 lb (66.2 kg)  11/22/22 147 lb (66.7 kg)   BP Readings from Last 3 Encounters:  03/21/23 (!) 130/90  12/17/22 130/80  11/30/22 134/80   Pulse Readings from Last 3 Encounters:  03/21/23 65  12/17/22 72  11/22/22 73    Renal function: CrCl cannot be calculated (Patient's most recent lab result is older than the maximum 21 days allowed.).  Past Medical History:  Diagnosis Date   Hypertension    on meds   IBS (irritable bowel syndrome)     Current Outpatient Medications on File Prior to Visit  Medication Sig Dispense Refill   amLODipine (NORVASC) 5 MG tablet Take 5 mg by mouth daily.     adapalene (DIFFERIN) 0.1 % cream Apply 0.1 % topically daily.     Azelaic Acid 15 % gel Apply 1 Application topically daily. COMPOUNDED FACIAL  MEDICATION FROM DERMATOLOGY     carvedilol (COREG) 12.5 MG tablet Take 1 tablet (12.5 mg total) by mouth 2 (two) times daily. 180 tablet 3   estradiol-norethindrone (ACTIVELLA) 1-0.5 MG tablet Take 1 tablet by mouth daily.     hydrochlorothiazide (HYDRODIURIL) 25 MG tablet Take 25 mg by mouth daily.     losartan (COZAAR) 50 MG tablet Take 50 mg by mouth daily.     Multiple Vitamin (MULTIVITAMIN) capsule Take 1 capsule by mouth daily.     No current facility-administered medications on file prior to visit.    No Known Allergies   Assessment/Plan:  1. Hypertension -  Patient BP in room 117/75 which is at goal of <130/80. Unfortunately home cuff not working today in room. Home readings she brought in are slightly higher than office reading. Advised patient to bring in cuff again on 3/13 for comparison. No medication changes needed today.  Continue: Amlodipine 5mg  daily Carvedilol 12.5mg  BID Losartan 100mg  daily Recheck as needed Hydrochlorothiazide added to allergy list  Laural Golden, PharmD, BCACP, CDCES, CPP [address], [address] [place], [place], [address] Phone: [phone], Fax: [phone]

## 2023-08-04 ENCOUNTER — Encounter: Payer: Self-pay | Admitting: Cardiology

## 2023-12-18 ENCOUNTER — Other Ambulatory Visit: Payer: Self-pay | Admitting: Cardiology

## 2023-12-24 ENCOUNTER — Other Ambulatory Visit: Payer: Self-pay

## 2023-12-24 ENCOUNTER — Emergency Department (HOSPITAL_BASED_OUTPATIENT_CLINIC_OR_DEPARTMENT_OTHER)
Admission: EM | Admit: 2023-12-24 | Discharge: 2023-12-25 | Disposition: A | Discharge status: Home / Self Care | Attending: Emergency Medicine | Admitting: Emergency Medicine

## 2023-12-24 ENCOUNTER — Encounter (HOSPITAL_BASED_OUTPATIENT_CLINIC_OR_DEPARTMENT_OTHER): Payer: Self-pay

## 2023-12-24 DIAGNOSIS — T7840XA Allergy, unspecified, initial encounter: Secondary | ICD-10-CM | POA: Diagnosis present

## 2023-12-24 DIAGNOSIS — Z91010 Allergy to peanuts: Secondary | ICD-10-CM | POA: Diagnosis not present

## 2023-12-24 MED ORDER — EPINEPHRINE 0.3 MG/0.3ML IJ SOAJ: 0.3000 mg | INTRAMUSCULAR | 0 refills | Status: AC | PRN

## 2023-12-24 MED ORDER — EPINEPHRINE 0.3 MG/0.3ML IJ SOAJ
INTRAMUSCULAR | Status: AC
  Administered 2023-12-24: 0.3 mg
  Filled 2023-12-24: qty 0.3

## 2023-12-24 NOTE — ED Provider Notes (Signed)
 [place] EMERGENCY DEPARTMENT AT [facility] Provider Note   CSN: [id number] Arrival date & time: 12/24/23  8048     Patient presents with: Allergic Reaction   Megan Cuevas is a 58 y.o. female.  With no known history of allergies who presents to the ED for suspected allergic reaction.  Patient reports eating a doughnut around 1830 tonight.  Approximately 1 hour later at 1930 she began to develop diffuse itching, repeated sneezing and sensation of lip and throat swelling.  No vomiting shortness of breath.  She does note that she had similar episodes of itching in the past after eating peanut butter.  No new medications or other new exposures she can identify.    Allergic Reaction      Prior to Admission medications   Medication Sig Start Date End Date Taking? Authorizing Provider  EPINEPHrine 0.3 mg/0.3 mL IJ SOAJ injection Inject 0.3 mg into the muscle as needed for anaphylaxis. 12/24/23  Yes Pamella Sharper A, DO  adapalene (DIFFERIN) 0.1 % cream Apply 0.1 % topically daily.    [provider]  amLODipine (NORVASC) 5 MG tablet Take 5 mg by mouth daily. 03/30/23   [provider]  Azelaic Acid 15 % gel Apply 1 Application topically daily. COMPOUNDED FACIAL MEDICATION FROM DERMATOLOGY    [provider]  carvedilol  (COREG ) 12.5 MG tablet TAKE 1 TABLET BY MOUTH 2 TIMES DAILY. 12/20/23   Kate Lonni CROME, MD  estradiol-norethindrone (ACTIVELLA) 1-0.5 MG tablet Take 1 tablet by mouth daily.    [provider]  losartan (COZAAR) 100 MG tablet Take 100 mg by mouth daily. 03/29/23   [provider]  Multiple Vitamin (MULTIVITAMIN) capsule Take 1 capsule by mouth daily.    [provider]    Allergies: Hydrochlorothiazide and Peanut-containing drug products    Review of Systems  Updated Vital Signs BP 110/75   Pulse 73   Temp 97.6 F (36.4 C) (Oral)   Resp 19   LMP 05/29/2013   SpO2 98%   Physical  Exam Vitals and nursing note reviewed.  HENT:     Head: Normocephalic and atraumatic.     Mouth/Throat:     Comments: Mild edema of lower lip Patent airway Uvula midline No tonsillar swelling Speaking full sentences Eyes:     Pupils: Pupils are equal, round, and reactive to light.  Cardiovascular:     Rate and Rhythm: Normal rate and regular rhythm.  Pulmonary:     Effort: Pulmonary effort is normal.     Breath sounds: Normal breath sounds. No wheezing.  Abdominal:     Palpations: Abdomen is soft.     Tenderness: There is no abdominal tenderness.  Skin:    General: Skin is warm and dry.     Findings: No rash.  Neurological:     Mental Status: She is alert.  Psychiatric:        Mood and Affect: Mood normal.     (all labs ordered are listed, but only abnormal results are displayed) Labs Reviewed - No data to display  EKG: EKG Interpretation Date/Time:  Saturday December 24 2023 19:57:38 EDT Ventricular Rate:  91 PR Interval:  175 QRS Duration:  69 QT Interval:  344 QTC Calculation: 424 R Axis:   72  Text Interpretation: Sinus rhythm Low voltage, precordial leads Probable anteroseptal infarct, old Confirmed by Pamella Sharper [phone]) on 12/24/2023 8:29:06 PM  Radiology: No results found.   Procedures   Medications Ordered in the ED  EPINEPHrine (EPI-PEN) 0.3 mg/0.3 mL injection (0.3 mg  Given 12/24/23 1955)    Clinical Course as of 12/24/23 2359  Sat Dec 24, 2023  2250 Reevaluated patient.  No progression of allergic symptoms.  Still reports sensation of swollen lower lip although improved from earlier.  Will continue to monitor [MP]  2359 No recurrence and allergic symptoms.  Patient has remained stable.  Will discharge with EpiPen prescription and referral for allergy testing [MP]    Clinical Course User Index [MP] Pamella Ozell LABOR, DO                                 Medical Decision Making 58 year old female presenting for suspected allergic reaction.   Likely food exposure potentially peanut.  Symptoms started around 1930.  Upon my initial assessment she was still having some itching and lip swelling.  She took Benadryl prior to arrival.  Considering concern for severe allergic reaction anaphylaxis with airway compromise we administered IM epi.  We will continue to monitor here to see if she needs additional epi.  No wheezing vomiting.  Does not require Solu-Medrol albuterol or Pepcid  as adjuncts at this time.  Is on restraints stable will discharge her with prescription for EpiPen instruction for allergy testing follow-up  Risk Prescription drug management.        Final diagnoses:  Allergic reaction, initial encounter    ED Discharge Orders          Ordered    EPINEPHrine 0.3 mg/0.3 mL IJ SOAJ injection  As needed        12/24/23 2027               Pamella Ozell LABOR, DO 12/24/23 2359

## 2023-12-24 NOTE — ED Triage Notes (Signed)
 Pt states that approx 30 minutes ago she began having diffuse itching and lips swelling and felt like her throat was swelling. Pt reports that she ate a donut 1 hour prior to symptoms and unsure if cooked in peanut oil. She has had itching in past after eating peanut butter.

## 2023-12-24 NOTE — ED Notes (Signed)
 Pt stated she is feeling much better but does feel like she still has some swelling to right side of her face and lip. Pt denies any ShOB or resp difficulty. Swelling has not progressed.

## 2023-12-24 NOTE — Discharge Instructions (Signed)
 You were seen in the emergency department after an allergic reaction This was likely due to a food exposure potentially peanuts Stay away from baked goods and anything containing peanuts until you are seen by an allergist Call the number listed above for the [facility] [place]  to schedule an allergy test soon as possible We have called in a prescription for an EpiPen for you to pick up from your pharmacy and use as directed for severe allergic reactions in the future Remember that if you use the EpiPen need to call 911 or come to the emergency department immediately Follow-up with your primary doctor within 1 week for reevaluation Return to the Emergency Department for any recurrent allergic symptoms

## 2023-12-25 MED ORDER — DIPHENHYDRAMINE HCL 50 MG/ML IJ SOLN
25.0000 mg | Freq: Once | INTRAMUSCULAR | Status: AC
  Administered 2023-12-25: 25 mg via INTRAVENOUS
  Filled 2023-12-25: qty 1

## 2023-12-25 NOTE — ED Notes (Signed)
 PT denies needs or symptoms of allergic reaction at this time. Pt resting on stretcher VSS NAD on room air. Pt states I feel good and I am ready to go home.

## 2023-12-25 NOTE — ED Notes (Signed)
 PT stated that she was starting to itch underneath her arms similar symptom to earlier allergic reaction. Dr. Pamella notified of same and med ordered placed.

## 2024-03-20 ENCOUNTER — Other Ambulatory Visit: Payer: Self-pay | Admitting: Cardiology
